# Patient Record
Sex: Male | Born: 1950
Health system: Southern US, Community
[De-identification: ages and names within clinical notes are randomized; demographics above are authoritative.]

## PROBLEM LIST (undated history)

## (undated) DIAGNOSIS — N183 Chronic kidney disease, stage 3 unspecified: Secondary | ICD-10-CM

## (undated) DIAGNOSIS — N529 Male erectile dysfunction, unspecified: Secondary | ICD-10-CM

## (undated) DIAGNOSIS — I10 Essential (primary) hypertension: Secondary | ICD-10-CM

## (undated) DIAGNOSIS — N318 Other neuromuscular dysfunction of bladder: Secondary | ICD-10-CM

## (undated) DIAGNOSIS — M51379 Other intervertebral disc degeneration, lumbosacral region without mention of lumbar back pain or lower extremity pain: Secondary | ICD-10-CM

## (undated) DIAGNOSIS — E66811 Obesity, class 1: Secondary | ICD-10-CM

## (undated) DIAGNOSIS — D72819 Decreased white blood cell count, unspecified: Secondary | ICD-10-CM

## (undated) DIAGNOSIS — D696 Thrombocytopenia, unspecified: Secondary | ICD-10-CM

## (undated) DIAGNOSIS — G4762 Sleep related leg cramps: Secondary | ICD-10-CM

## (undated) DIAGNOSIS — E669 Obesity, unspecified: Secondary | ICD-10-CM

## (undated) DIAGNOSIS — E785 Hyperlipidemia, unspecified: Secondary | ICD-10-CM

## (undated) DIAGNOSIS — N189 Chronic kidney disease, unspecified: Secondary | ICD-10-CM

## (undated) DIAGNOSIS — M160 Bilateral primary osteoarthritis of hip: Secondary | ICD-10-CM

## (undated) DIAGNOSIS — R600 Localized edema: Secondary | ICD-10-CM

## (undated) DIAGNOSIS — M1990 Unspecified osteoarthritis, unspecified site: Secondary | ICD-10-CM

## (undated) HISTORY — DX: Obesity, unspecified: E66.9

## (undated) HISTORY — DX: Hyperlipidemia, unspecified: E78.5

## (undated) HISTORY — DX: Obesity, class 1: E66.811

## (undated) HISTORY — DX: Essential (primary) hypertension: I10

## (undated) HISTORY — PX: TONSILLECTOMY: SUR1361

---

## 2004-07-25 ENCOUNTER — Ambulatory Visit (HOSPITAL_COMMUNITY): Admission: RE | Admit: 2004-07-25 | Discharge: 2004-07-25 | Payer: Self-pay | Admitting: *Deleted

## 2012-07-02 DIAGNOSIS — G4762 Sleep related leg cramps: Secondary | ICD-10-CM | POA: Insufficient documentation

## 2012-09-24 ENCOUNTER — Ambulatory Visit: Payer: Self-pay | Admitting: Cardiology

## 2012-09-27 ENCOUNTER — Ambulatory Visit: Payer: Self-pay | Admitting: Gastroenterology

## 2012-10-12 ENCOUNTER — Ambulatory Visit: Payer: Self-pay | Admitting: Cardiology

## 2012-10-14 ENCOUNTER — Ambulatory Visit (INDEPENDENT_AMBULATORY_CARE_PROVIDER_SITE_OTHER): Payer: BC Managed Care – PPO | Admitting: Cardiology

## 2012-10-14 ENCOUNTER — Encounter: Payer: Self-pay | Admitting: Cardiology

## 2012-10-14 VITALS — BP 120/60 | HR 63 | Ht 77.0 in | Wt 276.5 lb

## 2012-10-14 DIAGNOSIS — R609 Edema, unspecified: Secondary | ICD-10-CM

## 2012-10-14 DIAGNOSIS — R29898 Other symptoms and signs involving the musculoskeletal system: Secondary | ICD-10-CM

## 2012-10-14 DIAGNOSIS — R600 Localized edema: Secondary | ICD-10-CM

## 2012-10-14 DIAGNOSIS — I10 Essential (primary) hypertension: Secondary | ICD-10-CM

## 2012-10-14 DIAGNOSIS — N529 Male erectile dysfunction, unspecified: Secondary | ICD-10-CM

## 2012-10-14 DIAGNOSIS — E785 Hyperlipidemia, unspecified: Secondary | ICD-10-CM

## 2012-10-14 DIAGNOSIS — E669 Obesity, unspecified: Secondary | ICD-10-CM

## 2012-10-14 DIAGNOSIS — R252 Cramp and spasm: Secondary | ICD-10-CM

## 2012-10-14 NOTE — Patient Instructions (Addendum)
Please wear support stocking  Your physician has requested that you have a lower  extremity venous duplex. This test is an ultrasound of the veins in the legs . It looks at venous blood flow that carries blood from the heart to the legs . Allow one hour for a Lower Venous exam. . There are no restrictions or special instructions.   Your physician wants you to follow-up in 12 months Dr Herbie Baltimore.  You will receive a reminder letter in the mail two months in advance. If you don't receive a letter, please call our office to schedule the follow-up appointment.

## 2012-10-20 DIAGNOSIS — N318 Other neuromuscular dysfunction of bladder: Secondary | ICD-10-CM | POA: Insufficient documentation

## 2012-10-20 DIAGNOSIS — N529 Male erectile dysfunction, unspecified: Secondary | ICD-10-CM | POA: Insufficient documentation

## 2012-10-26 ENCOUNTER — Encounter (HOSPITAL_COMMUNITY): Payer: BC Managed Care – PPO

## 2012-10-28 ENCOUNTER — Encounter: Payer: Self-pay | Admitting: Cardiology

## 2012-10-28 ENCOUNTER — Ambulatory Visit (HOSPITAL_COMMUNITY)
Admission: RE | Admit: 2012-10-28 | Discharge: 2012-10-28 | Disposition: A | Discharge status: Home / Self Care | Payer: BC Managed Care – PPO | Source: Ambulatory Visit | Attending: Cardiology | Admitting: Cardiology

## 2012-10-28 DIAGNOSIS — M7989 Other specified soft tissue disorders: Secondary | ICD-10-CM

## 2012-10-28 DIAGNOSIS — R600 Localized edema: Secondary | ICD-10-CM

## 2012-10-28 DIAGNOSIS — I10 Essential (primary) hypertension: Secondary | ICD-10-CM | POA: Insufficient documentation

## 2012-10-28 DIAGNOSIS — E785 Hyperlipidemia, unspecified: Secondary | ICD-10-CM | POA: Insufficient documentation

## 2012-10-28 DIAGNOSIS — E669 Obesity, unspecified: Secondary | ICD-10-CM | POA: Insufficient documentation

## 2012-10-28 DIAGNOSIS — R252 Cramp and spasm: Secondary | ICD-10-CM

## 2012-10-28 DIAGNOSIS — R29898 Other symptoms and signs involving the musculoskeletal system: Secondary | ICD-10-CM | POA: Insufficient documentation

## 2012-10-28 NOTE — Assessment & Plan Note (Addendum)
He certainly is doing not activity and exertion.  He really was not in need to adjust his dietary regimen to ensure the calories his burning are are not in excess.

## 2012-10-28 NOTE — Assessment & Plan Note (Addendum)
He is now on Pravachol and omega-3 fatty acids that has been followed by his primary care physician.  He seems to be tolerating the protocol relatively well,  However he is having leg cramping.  We may want to consider giving him a medication holiday to see if the cramping improves.

## 2012-10-28 NOTE — Assessment & Plan Note (Signed)
Had previously been on Cialis.  He is no longer on that medication.

## 2012-10-28 NOTE — Assessment & Plan Note (Signed)
Well-controlled on current regimen which is a combination of ACE inhibitor and calcium channel blocker.  With a heart rate in the 60s, there would not be much from his beta blocker.

## 2012-10-28 NOTE — Progress Notes (Signed)
Patient ID: Stephen Schmidt, male   DOB: [birth year], 62 y.o.   MRN: [id number] PCP: No primary provider on file.  Clinic Note: Chief Complaint  Patient presents with  . 22 month visit    no chest pain ,no sob, little sweelling legs and feet   HPI: Stephen Schmidt is a 62 y.o. male, for patient of Dr. Julieanne Manson with a PMH of hypertension and hyperlipidemia as well as formally being morbidly obese.  He was last seen in November 2012, he now presents today for followup to refill medications..  Interval History: He seems to be doing relatively well overall.  Especially from cardiac standpoint.  He does significant amount of exertion with his spinning classes.  With that he denies any chest tightness or pressure or dyspnea at either rest or exertion.  He does note however the evening is having pretty significant cramping in his legs as well as a mild edema.  Despite his exercising, his diet has kind of gotten worse and he's gained back about 20 pounds in last time he was seen.  He denies any other likely thing would actual exercise, is more falling exertion.  He does note mild lower extremity edema with a mild varicose veins as well.  The remainder of Cardiovascular ROS: no chest pain or dyspnea on exertion negative for - dyspnea on exertion, irregular heartbeat, loss of consciousness, murmur, orthopnea, paroxysmal nocturnal dyspnea, rapid heart rate or shortness of breath is as follows: Additional cardiac review of systems: Lightheadedness - no, dizziness - no, syncope/near-syncope - no; TIA/amaurosis fugax - no Melena - no, hematochezia no; hematuria - no; nosebleeds - no; claudication - no  Past Medical History  Diagnosis Date  . Hypertension   . Hyperlipidemia   . Obesity, Class I, BMI 30.0-34.9 (see actual BMI)     However notably lighter used to be.  Once 324 pounds in 1996    Prior Cardiac Evaluation and Past Surgical History: History reviewed. No pertinent past surgical  history.    Not on File  Current Outpatient Prescriptions  Medication Sig Dispense Refill  . amLODipine-benazepril (LOTREL) 10-40 MG per capsule Take 1 capsule by mouth daily.      . Cyanocobalamin (VITAMIN B 12 PO) Take by mouth.      . Multiple Vitamin (MULTIVITAMIN) tablet Take 1 tablet by mouth daily.      . Omega-3 Fatty Acids (FISH OIL) 1000 MG CAPS Take 2,000 mg by mouth daily.      . pravastatin (PRAVACHOL) 20 MG tablet Take 20 mg by mouth daily.       No current facility-administered medications for this visit.    History   Social History Narrative   Married with 3 children.  Wife has signs of dementia.   Rare alcohol.  No cigarettes.  Good diet.   Routinely exercises doing bicycle "spinning "does it 3-4 days a week for up to 45 minutes at a time.   He is self-employed in a [employer].    ROS: A comprehensive Review of Systems - Negative except Lotion read edema, cramping as noted above.  No restless leg symptoms.  No exertional leg pains.  PHYSICAL EXAM BP 120/60  Pulse 63  Ht 6\' 5"  (1.956 m)  Wt 276 lb 8 oz (125.42 kg)  BMI 32.78 kg/m2 General appearance: alert, cooperative, appears stated age, no distress, mildly obese and Very tall.  Well-nourished and well-groomed.  Mild truncal obesity.  Normal mood and affect. Neck:  no adenopathy, no carotid bruit, no JVD, supple, symmetrical, trachea midline and thyroid not enlarged, symmetric, no tenderness/mass/nodules Lungs: clear to auscultation bilaterally, normal percussion bilaterally and Nonlabored, good air movement Heart: regular rate and rhythm, S1, S2 normal, no murmur, click, rub or gallop and normal apical impulse Abdomen: soft, non-tender; bowel sounds normal; no masses,  no organomegaly Extremities: extremities normal, atraumatic, no cyanosis or edema and Trace edema may be notable.  No venous stasis changes.  Does have some varicosities in the bilateral lower extremities. Pulses:  2+ and symmetric Neurologic: Alert and oriented X 3, normal strength and tone. Normal symmetric reflexes. Normal coordination and gait  [id number] today: Yes Rate: 63 , Rhythm: Normal Sinus Rhythm; ST depression in lead 3 only otherwise nonspecific ST-T changes.  No significant change.  Recent Labs: None really available.    ASSESSMENT / PLAN: Edema of both legs - with heaviness, swelling and cramping Simply based on his height, and that it is work requires him to be on his feet for some time, I do worry about him developing some venous stasis.   He otherwise does not have any symptoms to suggest heart failure as a source for his edema.  Plan: Lower extremity venous Dopplers; would consider arterial Dopplers if cramping and discomfort persists  Essential hypertension Well-controlled on current regimen which is a combination of ACE inhibitor and calcium channel blocker.  With a heart rate in the 60s, there would not be much from his beta blocker.  Hyperlipidemia He is now on Pravachol and omega-3 fatty acids that has been followed by his primary care physician.  He seems to be tolerating the protocol relatively well,  However he is having leg cramping.  We may want to consider giving him a medication holiday to see if the cramping improves.  Impotence of organic origin Had previously been on Cialis.  He is no longer on that medication.  Obesity (BMI 30-39.9) He certainly is doing not activity and exertion.  He really was not in need to adjust his dietary regimen to ensure the calories his burning are are not in excess.    Orders Placed This Encounter  Procedures  . EKG 12-Lead  . Lower Extremity Venous Duplex    Legs cramp Leg swelling Leg heaviness Look for venous insuff    Standing Status: Future     Number of Occurrences: 1     Standing Expiration Date: 10/14/2013    Order Specific Question:  Where should this test be performed:    Answer:  [facility]    Followup: 1 year  Orren Pietsch W. Herbie Baltimore, M.D., M.S. [facility] [address] [place], [place]  [address]  [phone] Pager # [phone]

## 2012-10-28 NOTE — Progress Notes (Signed)
Lower Extremity Venous Duplex Completed. °Brianna L Mazza,RVT °

## 2012-10-28 NOTE — Assessment & Plan Note (Signed)
Simply based on his height, and that it is work requires him to be on his feet for some time, I do worry about him developing some venous stasis.   He otherwise does not have any symptoms to suggest heart failure as a source for his edema.  Plan: Lower extremity venous Dopplers; would consider arterial Dopplers if cramping and discomfort persists

## 2012-11-04 ENCOUNTER — Telehealth: Payer: Self-pay | Admitting: *Deleted

## 2012-11-04 NOTE — Telephone Encounter (Signed)
Spoke to patient. Result given . Verbalized understanding  

## 2012-11-04 NOTE — Telephone Encounter (Signed)
Message copied by Tobin Chad on Thu Nov 04, 2012  4:27 PM ------      Message from: [facility], DAVID      Created: Tue Nov 02, 2012  5:19 PM       No sign of significant large vein insufficiency.            Marykay Lex, MD ------

## 2017-04-19 ENCOUNTER — Emergency Department
Admission: EM | Admit: 2017-04-19 | Discharge: 2017-04-19 | Disposition: A | Discharge status: Home / Self Care | Payer: PRIVATE HEALTH INSURANCE | Attending: Emergency Medicine | Admitting: Emergency Medicine

## 2017-04-19 ENCOUNTER — Encounter: Payer: Self-pay | Admitting: Emergency Medicine

## 2017-04-19 ENCOUNTER — Emergency Department: Payer: PRIVATE HEALTH INSURANCE

## 2017-04-19 DIAGNOSIS — S83411A Sprain of medial collateral ligament of right knee, initial encounter: Secondary | ICD-10-CM | POA: Diagnosis not present

## 2017-04-19 DIAGNOSIS — Y929 Unspecified place or not applicable: Secondary | ICD-10-CM | POA: Insufficient documentation

## 2017-04-19 DIAGNOSIS — Z79899 Other long term (current) drug therapy: Secondary | ICD-10-CM | POA: Insufficient documentation

## 2017-04-19 DIAGNOSIS — Y999 Unspecified external cause status: Secondary | ICD-10-CM | POA: Insufficient documentation

## 2017-04-19 DIAGNOSIS — Y939 Activity, unspecified: Secondary | ICD-10-CM | POA: Insufficient documentation

## 2017-04-19 DIAGNOSIS — S8991XA Unspecified injury of right lower leg, initial encounter: Secondary | ICD-10-CM | POA: Diagnosis present

## 2017-04-19 DIAGNOSIS — X501XXA Overexertion from prolonged static or awkward postures, initial encounter: Secondary | ICD-10-CM | POA: Diagnosis not present

## 2017-04-19 DIAGNOSIS — I10 Essential (primary) hypertension: Secondary | ICD-10-CM | POA: Diagnosis not present

## 2017-04-19 MED ORDER — TRAMADOL HCL 50 MG PO TABS: 50.0000 mg | ORAL_TABLET | Freq: Four times a day (QID) | ORAL | 0 refills | Status: DC | PRN

## 2017-04-19 MED ORDER — MELOXICAM 15 MG PO TABS: 15.0000 mg | ORAL_TABLET | Freq: Every day | ORAL | 0 refills | Status: DC

## 2017-04-19 NOTE — ED Triage Notes (Signed)
See first nurse note. 

## 2017-04-19 NOTE — ED Provider Notes (Signed)
[facility] Emergency Department Provider Note ____________________________________________  Time seen: Approximately 10:47 AM  I have reviewed the triage vital signs and the nursing notes.   HISTORY  Chief Complaint Knee Pain    HPI Stephen Schmidt is a 67 y.o. male who presents to the emergency department for evaluation and treatment of right knee and leg pain.  Last night, he slipped in some mud and twisted the right knee.  Patient has a significant past medical history of significant arthritis in both knees, patient did not fall and was able to walk back into the house after the injury.  He states that after sitting on the couch for a few minutes the knee became extremely painful and he had to crawl to the bedroom.  Today, he has had an increase in pain and has not been able to bear weight on the knee unless he used an umbrella to help support his weight.  He has not taken any medications for this pain. Past Medical History:  Diagnosis Date  . Hyperlipidemia   . Hypertension   . Obesity, Class I, BMI 30.0-34.9 (see actual BMI)    However notably lighter used to be.  Once 324 pounds in 1996    Patient Active Problem List   Diagnosis Date Noted  . Obesity (BMI 30-39.9) 10/28/2012    Class: Diagnosis of  . Essential hypertension 10/28/2012  . Edema of both legs - with heaviness, swelling and cramping 10/28/2012  . Hyperlipidemia   . Impotence of organic origin 10/20/2012  . Hypertonicity of bladder 10/20/2012  . Sleep related leg cramps 07/02/2012    History reviewed. No pertinent surgical history.  Prior to Admission medications   Medication Sig Start Date End Date Taking? Authorizing Provider  amLODipine-benazepril (LOTREL) 10-40 MG per capsule Take 1 capsule by mouth daily.    [provider]  Cyanocobalamin (VITAMIN B 12 PO) Take by mouth.    [provider]  meloxicam (MOBIC) 15 MG tablet Take 1 tablet (15 mg total) by mouth  daily. 04/19/17   Dnya Hickle, Rulon Eisenmenger B, FNP  Multiple Vitamin (MULTIVITAMIN) tablet Take 1 tablet by mouth daily.    [provider]  Omega-3 Fatty Acids (FISH OIL) 1000 MG CAPS Take 2,000 mg by mouth daily.    [provider]  pravastatin (PRAVACHOL) 20 MG tablet Take 20 mg by mouth daily.    [provider]  traMADol (ULTRAM) 50 MG tablet Take 1 tablet (50 mg total) by mouth every 6 (six) hours as needed. 04/19/17   Chinita Pester, FNP    Allergies Patient has no known allergies.  Family History  Problem Relation Age of Onset  . Heart failure Mother   . Kidney disease Sister   . Heart attack Cousin 46    Social History Social History   Tobacco Use  . Smoking status: Never Smoker  . Smokeless tobacco: Never Used  Substance Use Topics  . Alcohol use: Yes    Comment: wine from time to tie  . Drug use: No    Review of Systems Constitutional: Negative for recent illness. Cardiovascular: Negative for chest pain. Respiratory: Negative for cough or shortness of breath. Musculoskeletal: Positive for right knee pain. Skin: Negative for rash, lesion, or wound. Neurological: Negative for paresthesias or loss of sensation.  ____________________________________________   PHYSICAL EXAM:  VITAL SIGNS: Blood pressure is 136/64, heart rate 64, temperature is 98.2, respiratory rate is 20, SPO2 is 98% on room air. ED Triage  Vitals  Enc Vitals Group     BP      Pulse      Resp      Temp      Temp src      SpO2      Weight      Height      Head Circumference      Peak Flow      Pain Score      Pain Loc      Pain Edu?      Excl. in GC?     Constitutional: Alert and oriented. Well appearing and in no acute distress. Eyes: Conjunctivae are clear without discharge or drainage Head: Atraumatic Neck: Supple Respiratory: Respirations even and unlabored. Musculoskeletal: Right knee mildly edematous over the medial aspect.  Patient has increase in pain with  valgus stressor.  4 out of 5 strength against resistance secondary to pain.  No deformity is noted.  No pain on exam of the right hip or ankle. Neurologic: Motor and sensory function is intact, specifically over the right lower extremity. Skin: Intact Psychiatric: Affect and behavior are appropriate.  ____________________________________________   LABS (all labs ordered are listed, but only abnormal results are displayed)  Labs Reviewed - No data to display ____________________________________________  RADIOLOGY  Image of the right knee indicates no acute bony abnormality per radiology. ____________________________________________   PROCEDURES  Procedures  ____________________________________________   INITIAL IMPRESSION / ASSESSMENT AND PLAN / ED COURSE  Stephen Schmidt is a 67 y.o. male who presents to the emergency department after a twist injury to the right knee.  Patient has a significant past medical history of arthritis in the knees.  X-ray and exam are consistent with ligamentous source of pain.  Knee immobilizer was applied by ER tech.  And patient was given crutches.  He was instructed to use a crutch under the right side to assist with weightbearing.  He was encouraged to rest, ice, and elevate the extremity as well as wear the brace anytime he is out of bed.  He was instructed to follow-up with orthopedics if symptoms do not improve over the week.  He was instructed to return to the emergency department for symptoms of change or worsen if he is unable to schedule an appointment.  He will be given a prescription for tramadol and meloxicam.  Medications - No data to display  Pertinent labs & imaging results that were available during my care of the patient were reviewed by me and considered in my medical decision making (see chart for details).  _________________________________________   FINAL CLINICAL IMPRESSION(S) / ED DIAGNOSES  Final diagnoses:  Sprain of medial  collateral ligament of right knee, initial encounter    ED Discharge Orders        Ordered    meloxicam (MOBIC) 15 MG tablet  Daily     04/19/17 1251    traMADol (ULTRAM) 50 MG tablet  Every 6 hours PRN     04/19/17 1251       If controlled substance prescribed during this visit, 12 month history viewed on the [facility] prior to issuing an initial prescription for Schedule II or III opiod.    Chinita Pesterriplett, [place] B, FNP 04/19/17 1329    Governor RooksLord, Rebecca, MD 04/19/17 1355

## 2017-04-19 NOTE — ED Notes (Signed)
See triage note  States he slipped in mud last pm  Having pain to right leg/knee  States having increased pain with standing

## 2017-04-19 NOTE — ED Notes (Signed)
FN; pt to ed via ems pt fell last night slipped in the mud injuring right leg. vss per ems. Unable to bear weight on right leg.

## 2017-04-19 NOTE — ED Notes (Signed)
EMS pt from home , right knee pain last night, per ems states , Vitals are stable.

## 2017-12-03 DIAGNOSIS — M19011 Primary osteoarthritis, right shoulder: Secondary | ICD-10-CM | POA: Diagnosis not present

## 2017-12-03 DIAGNOSIS — M7541 Impingement syndrome of right shoulder: Secondary | ICD-10-CM | POA: Diagnosis not present

## 2017-12-03 DIAGNOSIS — M7551 Bursitis of right shoulder: Secondary | ICD-10-CM | POA: Diagnosis not present

## 2017-12-03 DIAGNOSIS — G8929 Other chronic pain: Secondary | ICD-10-CM | POA: Diagnosis not present

## 2017-12-03 DIAGNOSIS — M25511 Pain in right shoulder: Secondary | ICD-10-CM | POA: Diagnosis not present

## 2017-12-10 ENCOUNTER — Other Ambulatory Visit: Payer: Self-pay | Admitting: Sports Medicine

## 2017-12-10 DIAGNOSIS — M7541 Impingement syndrome of right shoulder: Secondary | ICD-10-CM

## 2017-12-10 DIAGNOSIS — M25511 Pain in right shoulder: Secondary | ICD-10-CM

## 2017-12-10 DIAGNOSIS — G8929 Other chronic pain: Secondary | ICD-10-CM

## 2017-12-24 ENCOUNTER — Ambulatory Visit: Admission: EM | Admit: 2017-12-24 | Discharge: 2017-12-24 | Discharge status: Absent without leave | Payer: PPO

## 2017-12-24 ENCOUNTER — Ambulatory Visit
Admission: RE | Admit: 2017-12-24 | Discharge: 2017-12-24 | Disposition: A | Discharge status: Home / Self Care | Payer: PPO | Source: Ambulatory Visit | Attending: Sports Medicine | Admitting: Sports Medicine

## 2017-12-24 DIAGNOSIS — M7541 Impingement syndrome of right shoulder: Secondary | ICD-10-CM | POA: Insufficient documentation

## 2017-12-24 DIAGNOSIS — M75121 Complete rotator cuff tear or rupture of right shoulder, not specified as traumatic: Secondary | ICD-10-CM | POA: Insufficient documentation

## 2017-12-24 DIAGNOSIS — S46111A Strain of muscle, fascia and tendon of long head of biceps, right arm, initial encounter: Secondary | ICD-10-CM | POA: Insufficient documentation

## 2017-12-24 DIAGNOSIS — G8929 Other chronic pain: Secondary | ICD-10-CM

## 2017-12-24 DIAGNOSIS — M75111 Incomplete rotator cuff tear or rupture of right shoulder, not specified as traumatic: Secondary | ICD-10-CM | POA: Diagnosis not present

## 2017-12-24 DIAGNOSIS — M25511 Pain in right shoulder: Secondary | ICD-10-CM | POA: Insufficient documentation

## 2017-12-24 DIAGNOSIS — X58XXXA Exposure to other specified factors, initial encounter: Secondary | ICD-10-CM | POA: Diagnosis not present

## 2017-12-27 ENCOUNTER — Ambulatory Visit
Admission: RE | Admit: 2017-12-27 | Discharge: 2017-12-27 | Disposition: A | Discharge status: Home / Self Care | Payer: PPO | Source: Ambulatory Visit | Attending: Sports Medicine | Admitting: Sports Medicine

## 2018-01-06 DIAGNOSIS — M75121 Complete rotator cuff tear or rupture of right shoulder, not specified as traumatic: Secondary | ICD-10-CM | POA: Diagnosis not present

## 2018-01-06 DIAGNOSIS — G8929 Other chronic pain: Secondary | ICD-10-CM | POA: Diagnosis not present

## 2018-01-06 DIAGNOSIS — M25511 Pain in right shoulder: Secondary | ICD-10-CM | POA: Diagnosis not present

## 2018-02-02 DIAGNOSIS — I10 Essential (primary) hypertension: Secondary | ICD-10-CM | POA: Diagnosis not present

## 2018-02-02 DIAGNOSIS — E7800 Pure hypercholesterolemia, unspecified: Secondary | ICD-10-CM | POA: Diagnosis not present

## 2018-02-04 ENCOUNTER — Other Ambulatory Visit: Payer: Self-pay

## 2018-02-04 ENCOUNTER — Other Ambulatory Visit: Payer: PPO

## 2018-02-04 ENCOUNTER — Encounter
Admission: RE | Admit: 2018-02-04 | Discharge: 2018-02-04 | Disposition: A | Discharge status: Home / Self Care | Payer: PPO | Source: Ambulatory Visit | Attending: Orthopedic Surgery | Admitting: Orthopedic Surgery

## 2018-02-04 DIAGNOSIS — R001 Bradycardia, unspecified: Secondary | ICD-10-CM | POA: Insufficient documentation

## 2018-02-04 DIAGNOSIS — Z01810 Encounter for preprocedural cardiovascular examination: Secondary | ICD-10-CM | POA: Diagnosis not present

## 2018-02-04 DIAGNOSIS — I10 Essential (primary) hypertension: Secondary | ICD-10-CM | POA: Insufficient documentation

## 2018-02-04 NOTE — Patient Instructions (Addendum)
Your procedure is scheduled on: Monday 02/15/18.  Report to DAY SURGERY DEPARTMENT LOCATED ON 2ND FLOOR MEDICAL MALL ENTRANCE. To find out your arrival time please call [phone] between 1PM - 3PM on Friday 02/12/18.  Remember: Instructions that are not followed completely may result in serious medical risk, up to and including death, or upon the discretion of your surgeon and anesthesiologist your surgery may need to be rescheduled.     _X__ 1. Do not eat food after midnight the night before your procedure.                 No gum chewing or hard candies. You may drink clear liquids up to 2 hours                 before you are scheduled to arrive for your surgery- DO NOT drink clear                 liquids within 2 hours of the start of your surgery.                 Clear Liquids include:  water, apple juice without pulp, clear carbohydrate                 drink such as Clearfast or Gatorade, Black Coffee or Tea (Do not add                 anything to coffee or tea).  __X__2.  On the morning of surgery brush your teeth with toothpaste and water, you may rinse your mouth with mouthwash if you wish.  Do not swallow any toothpaste or mouthwash.      _X__ 3.  No Alcohol for 24 hours before or after surgery.    _X__ 4.  Do Not Smoke or use e-cigarettes For 24 Hours Prior to Your Surgery.                 Do not use any chewable tobacco products for at least 6 hours prior to                 surgery.   __X__5.  Notify your doctor if there is any change in your medical condition      (cold, fever, infections).      Do not wear jewelry, make-up, hairpins, clips or nail polish. Do not wear lotions, powders, or perfumes.  Do not wear deodorant. Do not shave 48 hours prior to surgery. Men may shave face and neck. Do not bring valuables to the hospital.     [facility] not responsible for any belongings or valuables.   Contacts, dentures/partials or body piercings may not be worn  into surgery. Bring a case for your contacts, glasses or hearing aids, a denture cup will be supplied.    Patients discharged the day of surgery will not be allowed to drive home.    Please read over the following fact sheets that you were given:   MRSA Information   __X__ Take these medicines the morning of surgery with A SIP OF WATER:    1. acetaminophen (TYLENOL) 500 MG tablet     __X__ Use CHG Soap as directed    __X__ Stop Anti-inflammatories 7 days before surgery such as Advil, Ibuprofen, Motrin, BC or Goodies Powder, Naprosyn, Naproxen, Aleve, Aspirin, Meloxicam. May take Tylenol if needed for pain or discomfort.     __X__ Stop the following herbal supplements on Monday 02/08/18:  Apple  Cider Vinegar 600 MG CAPS Ascorbic Acid (VITAMIN C ADULT GUMMIES PO) Cinnamon 500 MG TABS Coenzyme Q10 (COQ10 GUMMIES ADULT PO) Misc Natural Products (HERBAL ENERGY COMPLEX PO) Chaga Mushroom Supplement Shark Cartilage 740 MG CAPS Turmeric 500 MG CAPS   __X__ Do not begin taking any new herbal supplements or vitamins before your surgery.

## 2018-02-09 DIAGNOSIS — Z0000 Encounter for general adult medical examination without abnormal findings: Secondary | ICD-10-CM | POA: Diagnosis not present

## 2018-02-09 DIAGNOSIS — E669 Obesity, unspecified: Secondary | ICD-10-CM | POA: Diagnosis not present

## 2018-02-09 DIAGNOSIS — Z862 Personal history of diseases of the blood and blood-forming organs and certain disorders involving the immune mechanism: Secondary | ICD-10-CM | POA: Diagnosis not present

## 2018-02-09 DIAGNOSIS — Z23 Encounter for immunization: Secondary | ICD-10-CM | POA: Diagnosis not present

## 2018-02-09 DIAGNOSIS — E7800 Pure hypercholesterolemia, unspecified: Secondary | ICD-10-CM | POA: Diagnosis not present

## 2018-02-09 DIAGNOSIS — I10 Essential (primary) hypertension: Secondary | ICD-10-CM | POA: Diagnosis not present

## 2018-02-10 DIAGNOSIS — M75121 Complete rotator cuff tear or rupture of right shoulder, not specified as traumatic: Secondary | ICD-10-CM | POA: Diagnosis not present

## 2018-02-15 ENCOUNTER — Ambulatory Visit: Payer: PPO | Admitting: Anesthesiology

## 2018-02-15 ENCOUNTER — Other Ambulatory Visit: Payer: Self-pay

## 2018-02-15 ENCOUNTER — Encounter: Payer: Self-pay | Admitting: Emergency Medicine

## 2018-02-15 ENCOUNTER — Encounter
Admission: RE | Disposition: A | Discharge status: Home / Self Care | Payer: Self-pay | Source: Ambulatory Visit | Attending: Orthopedic Surgery

## 2018-02-15 ENCOUNTER — Ambulatory Visit
Admission: RE | Admit: 2018-02-15 | Discharge: 2018-02-15 | Disposition: A | Discharge status: Home / Self Care | Payer: PPO | Source: Ambulatory Visit | Attending: Orthopedic Surgery | Admitting: Orthopedic Surgery

## 2018-02-15 DIAGNOSIS — Z8249 Family history of ischemic heart disease and other diseases of the circulatory system: Secondary | ICD-10-CM | POA: Insufficient documentation

## 2018-02-15 DIAGNOSIS — Z6830 Body mass index (BMI) 30.0-30.9, adult: Secondary | ICD-10-CM | POA: Diagnosis not present

## 2018-02-15 DIAGNOSIS — M7541 Impingement syndrome of right shoulder: Secondary | ICD-10-CM | POA: Insufficient documentation

## 2018-02-15 DIAGNOSIS — M75111 Incomplete rotator cuff tear or rupture of right shoulder, not specified as traumatic: Secondary | ICD-10-CM | POA: Diagnosis not present

## 2018-02-15 DIAGNOSIS — Z841 Family history of disorders of kidney and ureter: Secondary | ICD-10-CM | POA: Diagnosis not present

## 2018-02-15 DIAGNOSIS — M7581 Other shoulder lesions, right shoulder: Secondary | ICD-10-CM | POA: Insufficient documentation

## 2018-02-15 DIAGNOSIS — M75101 Unspecified rotator cuff tear or rupture of right shoulder, not specified as traumatic: Secondary | ICD-10-CM | POA: Diagnosis not present

## 2018-02-15 DIAGNOSIS — I10 Essential (primary) hypertension: Secondary | ICD-10-CM | POA: Insufficient documentation

## 2018-02-15 DIAGNOSIS — M19011 Primary osteoarthritis, right shoulder: Secondary | ICD-10-CM | POA: Diagnosis not present

## 2018-02-15 DIAGNOSIS — G8918 Other acute postprocedural pain: Secondary | ICD-10-CM | POA: Diagnosis not present

## 2018-02-15 DIAGNOSIS — M25511 Pain in right shoulder: Secondary | ICD-10-CM | POA: Diagnosis not present

## 2018-02-15 DIAGNOSIS — Z7982 Long term (current) use of aspirin: Secondary | ICD-10-CM | POA: Insufficient documentation

## 2018-02-15 DIAGNOSIS — E7800 Pure hypercholesterolemia, unspecified: Secondary | ICD-10-CM | POA: Diagnosis not present

## 2018-02-15 DIAGNOSIS — E785 Hyperlipidemia, unspecified: Secondary | ICD-10-CM | POA: Insufficient documentation

## 2018-02-15 DIAGNOSIS — M75121 Complete rotator cuff tear or rupture of right shoulder, not specified as traumatic: Secondary | ICD-10-CM | POA: Diagnosis not present

## 2018-02-15 DIAGNOSIS — M7521 Bicipital tendinitis, right shoulder: Secondary | ICD-10-CM | POA: Diagnosis not present

## 2018-02-15 DIAGNOSIS — Z79899 Other long term (current) drug therapy: Secondary | ICD-10-CM | POA: Diagnosis not present

## 2018-02-15 DIAGNOSIS — E669 Obesity, unspecified: Secondary | ICD-10-CM | POA: Diagnosis not present

## 2018-02-15 HISTORY — PX: SHOULDER ARTHROSCOPY WITH OPEN ROTATOR CUFF REPAIR: SHX6092

## 2018-02-15 SURGERY — ARTHROSCOPY, SHOULDER WITH REPAIR, ROTATOR CUFF, OPEN
Anesthesia: General | Site: Shoulder | Laterality: Right

## 2018-02-15 MED ORDER — SODIUM CHLORIDE 0.9 % IV SOLN
INTRAVENOUS | Status: DC | PRN
  Administered 2018-02-15: 30 ug/min via INTRAVENOUS

## 2018-02-15 MED ORDER — BUPIVACAINE LIPOSOME 1.3 % IJ SUSP
INTRAMUSCULAR | Status: AC
  Filled 2018-02-15: qty 20

## 2018-02-15 MED ORDER — MIDAZOLAM HCL 2 MG/2ML IJ SOLN
INTRAMUSCULAR | Status: AC
  Administered 2018-02-15: 1 mg via INTRAVENOUS
  Filled 2018-02-15: qty 2

## 2018-02-15 MED ORDER — MIDAZOLAM HCL 2 MG/2ML IJ SOLN
1.0000 mg | Freq: Once | INTRAMUSCULAR | Status: AC
  Administered 2018-02-15: 1 mg via INTRAVENOUS

## 2018-02-15 MED ORDER — CEFAZOLIN SODIUM-DEXTROSE 2-4 GM/100ML-% IV SOLN
INTRAVENOUS | Status: AC
  Filled 2018-02-15: qty 100

## 2018-02-15 MED ORDER — SUCCINYLCHOLINE CHLORIDE 20 MG/ML IJ SOLN
INTRAMUSCULAR | Status: DC | PRN
  Administered 2018-02-15: 120 mg via INTRAVENOUS

## 2018-02-15 MED ORDER — ONDANSETRON 4 MG PO TBDP: 4.0000 mg | ORAL_TABLET | Freq: Three times a day (TID) | ORAL | 0 refills | Status: DC | PRN

## 2018-02-15 MED ORDER — MIDAZOLAM HCL 2 MG/2ML IJ SOLN
INTRAMUSCULAR | Status: DC | PRN
  Administered 2018-02-15: 2 mg via INTRAVENOUS

## 2018-02-15 MED ORDER — ASPIRIN EC 325 MG PO TBEC: 325.0000 mg | DELAYED_RELEASE_TABLET | Freq: Every day | ORAL | 0 refills | Status: AC

## 2018-02-15 MED ORDER — GLYCOPYRROLATE 0.2 MG/ML IJ SOLN
INTRAMUSCULAR | Status: DC | PRN
  Administered 2018-02-15: 0.2 mg via INTRAVENOUS

## 2018-02-15 MED ORDER — FENTANYL CITRATE (PF) 100 MCG/2ML IJ SOLN
INTRAMUSCULAR | Status: DC | PRN
  Administered 2018-02-15: 100 ug via INTRAVENOUS

## 2018-02-15 MED ORDER — ACETAMINOPHEN 500 MG PO TABS: 1000.0000 mg | ORAL_TABLET | Freq: Three times a day (TID) | ORAL | 2 refills | Status: AC

## 2018-02-15 MED ORDER — PROPOFOL 10 MG/ML IV BOLUS
INTRAVENOUS | Status: DC | PRN
  Administered 2018-02-15: 40 mg via INTRAVENOUS
  Administered 2018-02-15: 200 mg via INTRAVENOUS

## 2018-02-15 MED ORDER — PROMETHAZINE HCL 25 MG/ML IJ SOLN: 6.2500 mg | INTRAMUSCULAR | Status: DC | PRN

## 2018-02-15 MED ORDER — ONDANSETRON HCL 4 MG/2ML IJ SOLN
INTRAMUSCULAR | Status: DC | PRN
  Administered 2018-02-15 (×2): 4 mg via INTRAVENOUS

## 2018-02-15 MED ORDER — LIDOCAINE HCL (CARDIAC) PF 100 MG/5ML IV SOSY
PREFILLED_SYRINGE | INTRAVENOUS | Status: DC | PRN
  Administered 2018-02-15: 100 mg via INTRAVENOUS

## 2018-02-15 MED ORDER — EPINEPHRINE 30 MG/30ML IJ SOLN
INTRAMUSCULAR | Status: AC
  Filled 2018-02-15: qty 1

## 2018-02-15 MED ORDER — BUPIVACAINE HCL (PF) 0.5 % IJ SOLN
INTRAMUSCULAR | Status: DC | PRN
  Administered 2018-02-15: 10 mL via PERINEURAL

## 2018-02-15 MED ORDER — PROPOFOL 10 MG/ML IV BOLUS
INTRAVENOUS | Status: AC
  Filled 2018-02-15: qty 20

## 2018-02-15 MED ORDER — BUPIVACAINE HCL (PF) 0.5 % IJ SOLN
INTRAMUSCULAR | Status: AC
  Filled 2018-02-15: qty 10

## 2018-02-15 MED ORDER — LIDOCAINE HCL (PF) 1 % IJ SOLN
INTRAMUSCULAR | Status: DC | PRN
  Administered 2018-02-15: 5 mL via SUBCUTANEOUS

## 2018-02-15 MED ORDER — LIDOCAINE HCL (PF) 1 % IJ SOLN
INTRAMUSCULAR | Status: AC
  Filled 2018-02-15: qty 5

## 2018-02-15 MED ORDER — LACTATED RINGERS IV SOLN
INTRAVENOUS | Status: DC
  Administered 2018-02-15 (×2): via INTRAVENOUS

## 2018-02-15 MED ORDER — FAMOTIDINE 20 MG PO TABS
20.0000 mg | ORAL_TABLET | Freq: Once | ORAL | Status: AC
  Administered 2018-02-15: 20 mg via ORAL

## 2018-02-15 MED ORDER — FAMOTIDINE 20 MG PO TABS
ORAL_TABLET | ORAL | Status: AC
  Administered 2018-02-15: 20 mg via ORAL
  Filled 2018-02-15: qty 1

## 2018-02-15 MED ORDER — FENTANYL CITRATE (PF) 100 MCG/2ML IJ SOLN
INTRAMUSCULAR | Status: AC
  Administered 2018-02-15: 50 ug via INTRAVENOUS
  Filled 2018-02-15: qty 2

## 2018-02-15 MED ORDER — OXYCODONE HCL 5 MG PO TABS: 5.0000 mg | ORAL_TABLET | ORAL | 0 refills | Status: DC | PRN

## 2018-02-15 MED ORDER — FENTANYL CITRATE (PF) 100 MCG/2ML IJ SOLN: 25.0000 ug | INTRAMUSCULAR | Status: DC | PRN

## 2018-02-15 MED ORDER — BUPIVACAINE LIPOSOME 1.3 % IJ SUSP
INTRAMUSCULAR | Status: DC | PRN
  Administered 2018-02-15: 20 mL via PERINEURAL

## 2018-02-15 MED ORDER — SUGAMMADEX SODIUM 200 MG/2ML IV SOLN
INTRAVENOUS | Status: DC | PRN
  Administered 2018-02-15: 233 mg via INTRAVENOUS

## 2018-02-15 MED ORDER — FENTANYL CITRATE (PF) 100 MCG/2ML IJ SOLN
50.0000 ug | Freq: Once | INTRAMUSCULAR | Status: AC
  Administered 2018-02-15: 50 ug via INTRAVENOUS

## 2018-02-15 MED ORDER — LACTATED RINGERS IV SOLN
INTRAVENOUS | Status: DC | PRN
  Administered 2018-02-15: 15 mL

## 2018-02-15 MED ORDER — FENTANYL CITRATE (PF) 100 MCG/2ML IJ SOLN
INTRAMUSCULAR | Status: AC
  Filled 2018-02-15: qty 2

## 2018-02-15 MED ORDER — CEFAZOLIN SODIUM-DEXTROSE 2-4 GM/100ML-% IV SOLN
2.0000 g | Freq: Once | INTRAVENOUS | Status: AC
  Administered 2018-02-15: 2 g via INTRAVENOUS

## 2018-02-15 MED ORDER — ROCURONIUM BROMIDE 100 MG/10ML IV SOLN
INTRAVENOUS | Status: DC | PRN
  Administered 2018-02-15: 10 mg via INTRAVENOUS
  Administered 2018-02-15: 40 mg via INTRAVENOUS

## 2018-02-15 MED ORDER — MIDAZOLAM HCL 2 MG/2ML IJ SOLN
INTRAMUSCULAR | Status: AC
  Filled 2018-02-15: qty 2

## 2018-02-15 MED ORDER — DEXAMETHASONE SODIUM PHOSPHATE 10 MG/ML IJ SOLN
INTRAMUSCULAR | Status: DC | PRN
  Administered 2018-02-15: 10 mg via INTRAVENOUS

## 2018-02-15 SURGICAL SUPPLY — 99 items
ADAPTER IRRIG TUBE 2 SPIKE SOL (ADAPTER) ×6 IMPLANT
ADH SKN CLS APL DERMABOND .7 (GAUZE/BANDAGES/DRESSINGS) ×1
ADPR TBG 2 SPK PMP STRL ASCP (ADAPTER) ×2
ANCH SUT 1.3 FBRTK 2LD BLU WHT (Anchor) ×2 IMPLANT
ANCH SUT 2X2.3 TAPE (Anchor) ×2 IMPLANT
ANCH SUT SWLK 19.1X4.75 (Anchor) ×4 IMPLANT
ANCHOR 2.3 SP SGL 1.2 XBRAID (Anchor) ×2 IMPLANT
ANCHOR 2.3MM SP SGL 1.2 XBRAID (Anchor) ×2 IMPLANT
ANCHOR SUT BIO SW 4.75X19.1 (Anchor) ×8 IMPLANT
ANCHOR SUT FBRTK SUTURETAP 1.3 (Anchor) ×4 IMPLANT
BIT DRILL RIGD1.8MM FBRTK STRL (DRILL) IMPLANT
BLADE OSCILLATING/SAGITTAL (BLADE)
BLADE SW THK.38XMED LNG THN (BLADE) IMPLANT
BUR BR 5.5 12 FLUTE (BURR) ×2 IMPLANT
BUR RADIUS 4.0X18.5 (BURR) ×2 IMPLANT
CANNULA 5.75X7CM (CANNULA) ×1
CANNULA PART THRD DISP 5.75X7 (CANNULA) ×1 IMPLANT
CANNULA PARTIAL THREAD 2X7 (CANNULA) ×2 IMPLANT
CANNULA TWIST IN 8.25X9CM (CANNULA) IMPLANT
CHLORAPREP W/TINT 26ML (MISCELLANEOUS) ×3 IMPLANT
CLOSURE WOUND 1/2 X4 (GAUZE/BANDAGES/DRESSINGS)
COOLER POLAR GLACIER W/PUMP (MISCELLANEOUS) ×3 IMPLANT
COVER LIGHT HANDLE STERIS (MISCELLANEOUS) ×3 IMPLANT
COVER WAND RF STERILE (DRAPES) ×3 IMPLANT
CRADLE LAMINECT ARM (MISCELLANEOUS) ×6 IMPLANT
DERMABOND ADVANCED (GAUZE/BANDAGES/DRESSINGS) ×2
DERMABOND ADVANCED .7 DNX12 (GAUZE/BANDAGES/DRESSINGS) IMPLANT
DEVICE SUCT BLK HOLE OR FLOOR (MISCELLANEOUS) ×2 IMPLANT
DRAPE IMP U-DRAPE 54X76 (DRAPES) ×6 IMPLANT
DRAPE INCISE IOBAN 66X45 STRL (DRAPES) ×3 IMPLANT
DRAPE SHEET LG 3/4 BI-LAMINATE (DRAPES) ×3 IMPLANT
DRAPE STERI 35X30 U-POUCH (DRAPES) ×3 IMPLANT
DRAPE U-SHAPE 47X51 STRL (DRAPES) ×3 IMPLANT
DRILL RIGID 1.8MM FBRTK STRL (DRILL) ×3
DRSG TEGADERM 4X4.75 (GAUZE/BANDAGES/DRESSINGS) ×6 IMPLANT
ELECT REM PT RETURN 9FT ADLT (ELECTROSURGICAL) ×3
ELECTRODE REM PT RTRN 9FT ADLT (ELECTROSURGICAL) ×1 IMPLANT
GAUZE PETRO XEROFOAM 1X8 (MISCELLANEOUS) ×3 IMPLANT
GAUZE SPONGE 4X4 12PLY STRL (GAUZE/BANDAGES/DRESSINGS) ×3 IMPLANT
GLOVE BIOGEL PI IND STRL 7.5 (GLOVE) IMPLANT
GLOVE BIOGEL PI IND STRL 8 (GLOVE) ×1 IMPLANT
GLOVE BIOGEL PI INDICATOR 7.5 (GLOVE) ×10
GLOVE BIOGEL PI INDICATOR 8 (GLOVE) ×2
GLOVE SURG SYN 8.0 (GLOVE) ×3 IMPLANT
GLOVE SURG SYN 8.0 PF PI (GLOVE) ×1 IMPLANT
GOWN STRL REUS W/ TWL LRG LVL3 (GOWN DISPOSABLE) ×1 IMPLANT
GOWN STRL REUS W/TWL LRG LVL3 (GOWN DISPOSABLE) ×9
GOWN STRL REUS W/TWL LRG LVL4 (GOWN DISPOSABLE) ×3 IMPLANT
IV LACTATED RINGER IRRG 3000ML (IV SOLUTION) ×45
IV LR IRRIG 3000ML ARTHROMATIC (IV SOLUTION) ×4 IMPLANT
KIT SPEAR STR 1.6MM DRILL (MISCELLANEOUS) ×2 IMPLANT
KIT STABILIZATION SHOULDER (MISCELLANEOUS) ×3 IMPLANT
KIT TURNOVER KIT A (KITS) ×3 IMPLANT
MANIFOLD NEPTUNE II (INSTRUMENTS) ×5 IMPLANT
MASK FACE SPIDER DISP (MASK) ×3 IMPLANT
MAT ABSORB  FLUID 56X50 GRAY (MISCELLANEOUS) ×4
MAT ABSORB FLUID 56X50 GRAY (MISCELLANEOUS) ×2 IMPLANT
NDL MAYO 6 CRC TAPER PT (NEEDLE) IMPLANT
NDL MAYO CATGUT SZ5 (NEEDLE) ×3
NDL SAFETY ECLIPSE 18X1.5 (NEEDLE) ×1 IMPLANT
NDL SCORPION MULTI FIRE (NEEDLE) IMPLANT
NDL SUT 5 .5 CRC TPR PNT MAYO (NEEDLE) IMPLANT
NEEDLE HYPO 18GX1.5 SHARP (NEEDLE) ×3
NEEDLE HYPO 22GX1.5 SAFETY (NEEDLE) ×3 IMPLANT
NEEDLE MAYO 6 CRC TAPER PT (NEEDLE) ×3 IMPLANT
NEEDLE SCORPION MULTI FIRE (NEEDLE) ×3 IMPLANT
NS IRRIG 1000ML POUR BTL (IV SOLUTION) ×2 IMPLANT
PACK ARTHROSCOPY SHOULDER (MISCELLANEOUS) ×3 IMPLANT
PAD ABD DERMACEA PRESS 5X9 (GAUZE/BANDAGES/DRESSINGS) ×1 IMPLANT
PAD WRAPON POLAR SHDR XLG (MISCELLANEOUS) ×1 IMPLANT
SET TUBE SUCT SHAVER OUTFL 24K (TUBING) ×3 IMPLANT
SET TUBE TIP INTRA-ARTICULAR (MISCELLANEOUS) ×3 IMPLANT
SLING ULTRA II M (MISCELLANEOUS) ×1 IMPLANT
STAPLER SKIN PROX 35W (STAPLE) IMPLANT
STRAP SAFETY 5IN WIDE (MISCELLANEOUS) ×3 IMPLANT
STRIP CLOSURE SKIN 1/2X4 (GAUZE/BANDAGES/DRESSINGS) IMPLANT
SUT ETHILON 3-0 (SUTURE) ×2 IMPLANT
SUT ETHILON 4-0 (SUTURE) ×3
SUT ETHILON 4-0 FS2 18XMFL BLK (SUTURE) ×1
SUT LASSO 90 DEG SD STR (SUTURE) IMPLANT
SUT MNCRL 4-0 (SUTURE) ×3
SUT MNCRL 4-0 27XMFL (SUTURE) ×1
SUT PROLENE 0 CT 2 (SUTURE) IMPLANT
SUT TICRON 2-0 30IN 311381 (SUTURE) IMPLANT
SUT TIGER TAPE 7 IN WHITE (SUTURE) ×2 IMPLANT
SUT VIC AB 0 CT1 36 (SUTURE) ×2 IMPLANT
SUT VIC AB 2-0 CT2 27 (SUTURE) ×2 IMPLANT
SUT VICRYL 3-0 27IN (SUTURE) IMPLANT
SUTURE ETHLN 4-0 FS2 18XMF BLK (SUTURE) ×1 IMPLANT
SUTURE MNCRL 4-0 27XMF (SUTURE) IMPLANT
SYR 10ML LL (SYRINGE) ×3 IMPLANT
TAPE CLOTH 3X10 WHT NS LF (GAUZE/BANDAGES/DRESSINGS) ×3 IMPLANT
TAPE MICROFOAM 4IN (TAPE) ×1 IMPLANT
TUBING ARTHRO INFLOW-ONLY STRL (TUBING) ×3 IMPLANT
TUBING CONNECTING 10 (TUBING) ×2 IMPLANT
TUBING CONNECTING 10' (TUBING) ×1
WAND HAND CNTRL MULTIVAC 90 (MISCELLANEOUS) ×3 IMPLANT
WAND WEREWOLF FLOW 90D (MISCELLANEOUS) ×2 IMPLANT
WRAPON POLAR PAD SHDR XLG (MISCELLANEOUS) ×3

## 2018-02-15 NOTE — Transfer of Care (Signed)
Immediate Anesthesia Transfer of Care Note  Patient: Stephen Schmidt  Procedure(s) Performed: SHOULDER ARTHROSCOPY WITH OPEN ROTATOR CUFF REPAIR,SUBACROMINAL DECOMPRESSION,DISTAL CLAVICLE EXCISION,BICEPS TENODESIS, & SUBSCAPULARIS . (Right Shoulder)  Patient Location: PACU  Anesthesia Type:General  Level of Consciousness: sedated  Airway & Oxygen Therapy: Patient Spontanous Breathing and Patient connected to face mask oxygen  Post-op Assessment: Report given to RN and Post -op Vital signs reviewed and stable  Post vital signs: Reviewed and stable  Last Vitals:  Vitals Value Taken Time  BP    Temp    Pulse 63 02/15/2018  4:04 PM  Resp 16 02/15/2018  4:04 PM  SpO2 99 % 02/15/2018  4:04 PM  Vitals shown include unvalidated device data.  Last Pain:  Vitals:   02/15/18 0952  TempSrc: Tympanic  PainSc: 0-No pain         Complications: No apparent anesthesia complications

## 2018-02-15 NOTE — Anesthesia Postprocedure Evaluation (Signed)
Anesthesia Post Note  Patient: Stephen Schmidt  Procedure(s) Performed: SHOULDER ARTHROSCOPY WITH OPEN ROTATOR CUFF REPAIR,SUBACROMINAL DECOMPRESSION,DISTAL CLAVICLE EXCISION,BICEPS TENODESIS, & SUBSCAPULARIS . (Right Shoulder)  Patient location during evaluation: PACU Anesthesia Type: General Level of consciousness: awake and alert Pain management: pain level controlled Vital Signs Assessment: post-procedure vital signs reviewed and stable Respiratory status: spontaneous breathing and respiratory function stable Cardiovascular status: stable Anesthetic complications: no     Last Vitals:  Vitals:   02/15/18 1700 02/15/18 1743  BP: 133/63 134/66  Pulse: (!) 55 (!) 55  Resp: 18   Temp: (!) 36.1 C   SpO2: 98%     Last Pain:  Vitals:   02/15/18 1700  TempSrc:   PainSc: 0-No pain                 Jamone Garrido K

## 2018-02-15 NOTE — Anesthesia Procedure Notes (Signed)
Anesthesia Regional Block: Interscalene brachial plexus block   Pre-Anesthetic Checklist: ,, timeout performed, Correct Patient, Correct Site, Correct Laterality, Correct Procedure, Correct Position, site marked, Risks and benefits discussed,  Surgical consent,  Pre-op evaluation,  At surgeon's request and post-op pain management  Laterality: Right and Upper  Prep: chloraprep       Needles:  Injection technique: Single-shot  Needle Type: Stimiplex     Needle Length: 5cm  Needle Gauge: 22     Additional Needles:   Procedures:,,,, ultrasound used (permanent image in chart),,,,  Narrative:  Start time: 02/15/2018 10:24 AM End time: 02/15/2018 10:28 AM Injection made incrementally with aspirations every 5 mL.  Performed by: Personally  Anesthesiologist: Lenard SimmerKarenz, Bentlee Benningfield, MD  Additional Notes: Functioning IV was confirmed and monitors were applied.  A 50mm 22ga Stimuplex needle was used. Sterile prep and drape,hand hygiene and sterile gloves were used.  Negative aspiration and negative test dose prior to incremental administration of local anesthetic. The patient tolerated the procedure well.

## 2018-02-15 NOTE — Discharge Instructions (Signed)
AMBULATORY SURGERY  °DISCHARGE INSTRUCTIONS ° ° °1) The drugs that you were given will stay in your system until tomorrow so for the next 24 hours you should not: ° °A) Drive an automobile °B) Make any legal decisions °C) Drink any alcoholic beverage ° ° °2) You may resume regular meals tomorrow.  Today it is better to start with liquids and gradually work up to solid foods. ° °You may eat anything you prefer, but it is better to start with liquids, then soup and crackers, and gradually work up to solid foods. ° ° °3) Please notify your doctor immediately if you have any unusual bleeding, trouble breathing, redness and pain at the surgery site, drainage, fever, or pain not relieved by medication. °4)  ° °5) Your post-operative visit with Dr.                     °           °     is: Date:                        Time:   ° °Please call to schedule your post-operative visit. ° °6) Additional Instructions: ° ° ° ° ° ° °Post-Op Instructions - Rotator Cuff Repair ° °1. Bracing: You will wear a shoulder immobilizer or sling for 6 weeks.  ° °2. Driving: No driving for 3 weeks post-op. When driving, do not wear the immobilizer. Ideally, we recommend no driving for 6 weeks while sling is in place as one arm will be immobilized.  ° °3. Activity: No active lifting for 2 months. Wrist, hand, and elbow motion only. Avoid lifting the upper arm away from the body except for hygiene. You are permitted to bend and straighten the elbow passively only (no active elbow motion). You may use your hand and wrist for typing, writing, and managing utensils (cutting food). Do not lift more than a coffee cup for 8 weeks.  When sleeping or resting, inclined positions (recliner chair or wedge pillow) and a pillow under the forearm for support may provide better comfort for up to 4 weeks.  Avoid long distance travel for 4 weeks. ° °Return to normal activities after rotator cuff repair repair normally takes 6 months on average. If rehab goes very  well, may be able to do most activities at 4 months, except overhead or contact sports. ° °4. Physical Therapy: Begins 3-4 days after surgery, and proceed 1 time per week for the first 6 weeks, then 1-2 times per week from weeks 6-20 post-op. ° °5. Medications:  °- You will be provided a prescription for narcotic pain medicine. After surgery, take 1-2 narcotic tablets every 4 hours if needed for severe pain.  °- A prescription for anti-nausea medication will be provided in case the narcotic medicine causes nausea - take 1 tablet every 6 hours only if nauseated.   °- Take tylenol 1000 mg (2 Extra Strength tablets or 3 regular strength) every 8 hours for pain.  May decrease or stop tylenol 5 days after surgery if you are having minimal pain. °- Take ASA 325mg/day x 2 weeks to help prevent DVTs/PEs (blood clots).  °- DO NOT take ANY nonsteroidal anti-inflammatory pain medications (Advil, Motrin, Ibuprofen, Aleve, Naproxen, or Naprosyn). These medicines can inhibit healing of your shoulder repair.  ° ° °If you are taking prescription medication for anxiety, depression, insomnia, muscle spasm, chronic pain, or for attention deficit disorder, you   are advised that you are at a higher risk of adverse effects with use of narcotics post-op, including narcotic addiction/dependence, depressed breathing, death. °If you use non-prescribed substances: alcohol, marijuana, cocaine, heroin, methamphetamines, etc., you are at a higher risk of adverse effects with use of narcotics post-op, including narcotic addiction/dependence, depressed breathing, death. °You are advised that taking > 50 morphine milligram equivalents (MME) of narcotic pain medication per day results in twice the risk of overdose or death. For your prescription provided: oxycodone 5 mg - taking more than 6 tablets per day would result in > 50 morphine milligram equivalents (MME) of narcotic pain medication. °Be advised that we will prescribe narcotics short-term,  for acute post-operative pain only - 3 weeks for major operations such as shoulder repair/reconstruction surgeries.  ° ° ° °6. Post-Op Appointment: ° °Your first post-op appointment will be 10-14 days post-op. ° °7. Work or School: For most, but not all procedures, we advise staying out of work or school for at least 1 to 2 weeks in order to recover from the stress of surgery and to allow time for healing.  ° °If you need a work or school note this can be provided.  ° °8. Smoking: If you are a smoker, you need to refrain from smoking in the postoperative period. The nicotine in cigarettes will inhibit healing of your shoulder repair and decrease the chance of successful repair. Similarly, nicotine containing products (gum, patches) should be avoided.  ° °Post-operative Brace: °Apply and remove the brace you received as you were instructed to at the time of fitting and as described in detail as the brace’s instructions for use indicate.  Wear the brace for the period of time prescribed by your physician.  The brace can be cleaned with soap and water and allowed to air dry only.  Should the brace result in increased pain, decreased feeling (numbness/tingling), increased swelling or an overall worsening of your medical condition, please contact your doctor immediately.  If an emergency situation occurs as a result of wearing the brace after normal business hours, please dial 911 and seek immediate medical attention.  Let your doctor know if you have any further questions about the brace issued to you. °Refer to the shoulder sling instructions for use if you have any questions regarding the correct fit of your shoulder sling.  °[facility] [employer] for Troubleshooting: [phone] ° °Video that illustrates how to properly use a shoulder sling: °"Instructions for Proper Use of an Orthopaedic Sling" °https://www.youtube.com/watch?v=AHZpn_Xo45w ° ° ° ° ° °

## 2018-02-15 NOTE — H&P (Signed)
Paper H&P to be scanned into permanent record. H&P reviewed. No significant changes noted.  

## 2018-02-15 NOTE — Anesthesia Procedure Notes (Addendum)
Procedure Name: Intubation Date/Time: 02/15/2018 12:00 PM Performed by: Nelda Marseille, CRNA Pre-anesthesia Checklist: Patient identified, Patient being monitored, Timeout performed, Emergency Drugs available and Suction available Patient Re-evaluated:Patient Re-evaluated prior to induction Oxygen Delivery Method: Circle System Utilized Preoxygenation: Pre-oxygenation with 100% oxygen Induction Type: IV induction Ventilation: Mask ventilation without difficulty Laryngoscope Size: Mac and 4 Grade View: Grade III Tube type: Oral Tube size: 8.0 mm Number of attempts: 1 Airway Equipment and Method: Stylet and Video-laryngoscopy Placement Confirmation: ETT inserted through vocal cords under direct vision,  positive ETCO2 and breath sounds checked- equal and bilateral Secured at: 21 cm Tube secured with: Tape Dental Injury: Teeth and Oropharynx as per pre-operative assessment  Difficulty Due To: Difficulty was anticipated, Difficult Airway- due to large tongue and Difficult Airway- due to anterior larynx

## 2018-02-15 NOTE — OR Nursing (Signed)
Discharge instructions discussed with patient and daughter. Both voice understanding. 

## 2018-02-15 NOTE — Anesthesia Post-op Follow-up Note (Signed)
Anesthesia QCDR form completed.        

## 2018-02-15 NOTE — Anesthesia Preprocedure Evaluation (Signed)
Anesthesia Evaluation  Patient identified by MRN, date of birth, ID band Patient awake    Reviewed: Allergy & Precautions, H&P , NPO status , Patient's Chart, lab work & pertinent test results, reviewed documented beta blocker date and time   History of Anesthesia Complications Negative for: history of anesthetic complications  Airway Mallampati: I  TM Distance: >3 FB Neck ROM: full    Dental  (+) Dental Advidsory Given, Teeth Intact   Pulmonary neg pulmonary ROS,           Cardiovascular Exercise Tolerance: Good hypertension, (-) angina(-) CAD, (-) Past MI, (-) Cardiac Stents and (-) CABG (-) dysrhythmias (-) Valvular Problems/Murmurs     Neuro/Psych negative neurological ROS  negative psych ROS   GI/Hepatic negative GI ROS, Neg liver ROS,   Endo/Other  negative endocrine ROS  Renal/GU negative Renal ROS  negative genitourinary   Musculoskeletal   Abdominal   Peds  Hematology negative hematology ROS (+)   Anesthesia Other Findings Past Medical History: No date: Hyperlipidemia No date: Hypertension No date: Obesity, Class I, BMI 30.0-34.9 (see actual BMI)     Comment:  However notably lighter used to be.  Once 324 pounds in               1996   Reproductive/Obstetrics negative OB ROS                             Anesthesia Physical Anesthesia Plan  ASA: II  Anesthesia Plan: General   Post-op Pain Management:  Regional for Post-op pain   Induction: Intravenous  PONV Risk Score and Plan: 2 and Ondansetron, Dexamethasone, Midazolam, Promethazine and Treatment may vary due to age or medical condition  Airway Management Planned: Oral ETT  Additional Equipment:   Intra-op Plan:   Post-operative Plan: Extubation in OR  Informed Consent: I have reviewed the patients History and Physical, chart, labs and discussed the procedure including the risks, benefits and alternatives for the  proposed anesthesia with the patient or authorized representative who has indicated his/her understanding and acceptance.   Dental Advisory Given  Plan Discussed with: Anesthesiologist, CRNA and Surgeon  Anesthesia Plan Comments:         Anesthesia Quick Evaluation

## 2018-02-15 NOTE — Op Note (Signed)
SURGERY DATE: 02/15/2018  PRE-OP DIAGNOSIS:  1. Right rotator cuff tear (subscapularis, supraspinatus) 2. Right acromioclavicular joint osteoarthritis 3. Right subacromial impingement 4. Right biceps tendinopathy  POST-OP DIAGNOSIS: 1. Right rotator cuff tear (subscapularis, supraspinatus, infrapinatus) 2. Right acromioclavicular joint osteoarthritis 3. Right subacromial impingement 4. Right biceps tendinopathy  PROCEDURES:  1. Right arthroscopic rotator cuff repair (subscapularis) 2. Right mini-open rotator cuff repair (supraspinatus and infraspinatus) 3. Right open biceps tenodesis 4. Right arthroscopic distal clavicle excision 5. Right arthroscopic extensive debridement of shoulder (glenohumeral and subacromial spaces) 6. Right arthroscopic subacromial decompression  SURGEON: Rosealee AlbeeSunny H. Britiany Silbernagel, MD  ASSISTANT: Sonny DandyJ. Todd Mundy, PA  ANESTHESIA: Gen with Exparil interscalene block  ESTIMATED BLOOD LOSS: 25cc  DRAINS:  none  TOTAL IV FLUIDS: per anesthesia   SPECIMENS: none  IMPLANTS:  - Arthrex 4.2475mm SwiveLock x 4 - Arthrex FiberTak Suture Anchor - Double Loaded x2 - Iconix SPEED double loaded with 1.2 and 2.630mm tape x 2   OPERATIVE FINDINGS:  Examination under anesthesia: A careful examination under anesthesia was performed.  Passive range of motion was: FF: 160; ER at side: 50; ER in abduction: 90; IR in abduction: 50.  Anterior load shift: NT.  Posterior load shift: NT.  Sulcus in neutral: NT.  Sulcus in ER: NT.    Intra-operative findings: A thorough arthroscopic examination of the shoulder was performed.  The findings are: 1. Biceps tendon: Significant tendinopathy with erythema and thickening of the proximal biceps 2. Superior labrum: injected with surrounding synovitis 3. Posterior labrum and capsule: normal 4. Inferior capsule and inferior recess: normal 5. Glenoid cartilage surface: Focal area of grade 1-2 changes  6. Supraspinatus attachment: full-thickness  tear 7. Posterior rotator cuff attachment: Full-thickness tear of tear of anterior infraspinatus 8. Humeral head articular cartilage: normal 9. Rotator interval: significant synovitis 10: Subscapularis tendon: Partial-thickness tear of the superior fibers 11. Anterior labrum: degenerative 12. IGHL: Normal  OPERATIVE REPORT:   Indications for procedure: Mihail Jesse SansC Nieland is a 67 y.o. male with ~4-5 months of R shoulder pain that began without known traumatic event.  He has failed non-operative management including activity modification, physical therapy, and medical management without adequate relief of symptoms. Clinical exam and MRI were suggestive of rotator cuff tear including subscapularis, full-thickness supraspinatus, and infraspinatus tears; subacromial impingement; biceps tendinopathy; and acromioclavicular joint arthritis.  After discussion of risks, benefits, and alternatives to surgery, the patient elected to proceed.   Procedure in detail:  I identified Keymon C Kalbfleisch in the pre-operative holding area.  I marked the operative shoulder with my initials. I reviewed the risks and benefits of the proposed surgical intervention, and the patient (and/or patient's guardian) wished to proceed.  Anesthesia was then performed with an Exparil interscalene block.  The patient was transferred to the operative suite and placed in the beach chair position.    SCDs were placed on the lower extremities. Appropriate IV antibiotics were administered prior to incision. The operative upper extremity was then prepped and draped in standard fashion. A time out was performed confirming the correct extremity, correct patient, and correct procedure.   I then created a standard posterior portal with an 11 blade. The glenohumeral joint was easily entered with a blunt trochar and the arthroscope introduced. The findings of diagnostic arthroscopy are described above.  A standard anterior portal was made.  I debrided  degenerative tissue including the synovitic tissue about the rotator interval and IGHL as well as the anterior and superior labrum. I then coagulated the  inflamed synovium to obtain hemostasis and reduce the risk of post-operative swelling using an Arthrocare radiofrequency device.  The biceps tendon was cut at its insertion on the superior labrum with arthroscopic scissors.  The subscapularis tear was identified. The comma tissue indicating the superolateral border of the subscapularis was identified readily.  The tip of the coracoid as well as the conjoined tendon and coracoacromial ligaments were visualized after debriding rotator interval tissue.  Tissue about the subscapularis was released anteriorly, superiorly, and posteriorly to allow for improved mobilization.  The lesser tuberosity footprint was prepared with a combination of electrocautery and an arthroscopic curette.  Through the anterior portal, A Scorpion suture passing device was used to pass a FiberTape in a mattress fashion through the subscapularis tendon.  This was passed  through a 4.75 mm SwiveLock and placed into the lesser tuberosity at the footprint of the subscapularis tendon with the arm in a neutral position. This appropriately reduced the subscapularis tear.  The arm was then internally and externally rotated and the subscapularis was noted to move appropriately with rotation.  Next, the arthroscope was then introduced into the subacromial space. A direct lateral portal was created with an 11-blade after spinal needle localization. An extensive subacromial bursectomy was performed using a combination of the shaver and Arthrocare wand. The entire acromial undersurface was exposed and the CA ligament was subperiosteally elevated to expose the anterior acromial hook. A 5.71mm barrel burr was used to create a flat anterior and lateral aspect of the acromion, converting it from a Type 2 to a Type 1 acromion. Care was made to keep the  deltoid fascia intact.  I then turned my attention to the arthroscopic distal clavicle excision. I identified the acromioclavicular joint. Surrounding bursal tissue was debrided and the edges of the joint were identified. I used the 5.88mm barrel burr to remove the distal clavicle parallel to the edge of the acromion. I was able to fit two widths of the burr into the space between the distal clavicle and acromion, signifying that I had removed ~49mm of distal clavicle. This was confirmed by viewing anteriorly and introducing a probe with measuring marks from the lateral portal. Hemostasis was achieved with an Arthrocare wand. Fluid was evacuated from the shoulder.  The full-thickness supraspinatus and partial infraspinatus tear was visualized and decision was made to proceed with a mini open approach.  A longitudinal incision from the anterolateral acromion ~6cm in length was made overlying the raphe between the anterior and middle heads of the deltoid.  The raphe was identified and it was incised. The subacromial space was identified. Any remaining bursa was excised. The rotator cuff tear involving the supraspinatus and part of the infraspinatus was identified. It was a large U-shaped tear with apex retracted to the level just medial to the apex of the humeral head.  We then turned our attention to the biceps tenodesis. The arm was externally rotated.  The bicipital groove was identified.  A 15 blade was used to make a cut overlying the biceps tendon, and the tendon was removed using a right angle clamp.  The base of the bicipital groove was identified and cleared of soft tissue.  A FiberTak anchor was placed in the bicipital groove.  The biceps tendon was held at the appropriate amount of tension.  One set of sutures was passed through the biceps anchor with one limb passed in a simple fashion and the second limb passed in a simple plus locking stitch pattern.  This was repeated for the other set of sutures.   This construct allowed for shuttling the biceps tendon down to the bone.  The sutures were tied and cut.  The diseased portion of the proximal biceps was then excised.  The arm was then internally rotated.  The rotator cuff footprint was cleared of soft tissue and the footprint was smoothed and bleeding bone over the footprint was created with a rongeur.  Soft tissue was released both superiorly and inferiorly to the rotator cuff to allow for adequate mobilization.  The rotator cuff was able to be reduced almost completely with mild undercoverage of the footprint at the apex of the tear.  Two Iconix SPEED anchors were placed just lateral to the articular margin.  The rotator cuff was held in a reduced position and all limbs of suture were passed appropriately with a scorpion suture passer. Two SwiveLock anchors were placed for the lateral row anchors with one limb of each of the four medial row sutures passed through each anchor.  This allowed for adequate reapproximation and compression of the supraspinatus over its footprint.  However, the infraspinatus was not completely reduced.  An Arthrex knotless FiberTak was placed at the articular margin, posterior to the other 2 anchors.  All 4 strands of suture were passed through the posterior rotator cuff.  These sutures were then tied, reducing the infraspinatus down to its footprint.  These were then passed through another lateral row anchor that was placed inferior and slightly lateral to the previously placed posterior lateral row anchor.  Additionally, two 2-0 FiberWire sutures from the lateral row anchors were passed in a mattress fashion and subsequently tied to reduce dogears and allow for further reduction of the rotator cuff.  At this point, the rotator cuff now covered almost the entirety of the footprint with the exception being right at the apex of the tear, where there was partial coverage of the footprint. The construct was stable with external and  internal rotation.  The wound was thoroughly irrigated.  The deltoid split was closed with 0 Vicryl.  The subdermal layer was closed with 2-0 Vicryl.  The skin was closed with staples. The portals were closed with 3-0 Nylon. Xeroform was applied to the incisions. A sterile dressing was applied, followed by a Polar Care sleeve and a SlingShot shoulder immobilizer/sling. The patient was awakened from anesthesia without difficulty and was transferred to the PACU in stable condition.     COMPLICATIONS: none  DISPOSITION: plan for discharge home after recovery in PACU   POSTOPERATIVE PLAN: Remain in sling (except hygiene and elbow/wrist/hand RoM exercises as instructed by PT) x 6 weeks and NWB for this time. PT to begin 3-4 days after surgery.  Use large rotator cuff repair rehab protocol with subscapularis repair.  ASA 325mg  daily x 2 weeks for DVT ppx.

## 2018-02-16 ENCOUNTER — Encounter: Payer: Self-pay | Admitting: Orthopedic Surgery

## 2018-02-18 DIAGNOSIS — M25611 Stiffness of right shoulder, not elsewhere classified: Secondary | ICD-10-CM | POA: Diagnosis not present

## 2018-02-18 DIAGNOSIS — R29898 Other symptoms and signs involving the musculoskeletal system: Secondary | ICD-10-CM | POA: Diagnosis not present

## 2018-02-18 DIAGNOSIS — M25511 Pain in right shoulder: Secondary | ICD-10-CM | POA: Diagnosis not present

## 2018-02-18 DIAGNOSIS — Z9889 Other specified postprocedural states: Secondary | ICD-10-CM | POA: Diagnosis not present

## 2018-02-26 DIAGNOSIS — Z9889 Other specified postprocedural states: Secondary | ICD-10-CM | POA: Diagnosis not present

## 2018-02-26 DIAGNOSIS — M25511 Pain in right shoulder: Secondary | ICD-10-CM | POA: Diagnosis not present

## 2018-03-05 DIAGNOSIS — M25511 Pain in right shoulder: Secondary | ICD-10-CM | POA: Diagnosis not present

## 2018-03-10 DIAGNOSIS — L600 Ingrowing nail: Secondary | ICD-10-CM | POA: Diagnosis not present

## 2018-03-10 DIAGNOSIS — M2042 Other hammer toe(s) (acquired), left foot: Secondary | ICD-10-CM | POA: Diagnosis not present

## 2018-03-10 DIAGNOSIS — M2041 Other hammer toe(s) (acquired), right foot: Secondary | ICD-10-CM | POA: Diagnosis not present

## 2018-03-10 DIAGNOSIS — M779 Enthesopathy, unspecified: Secondary | ICD-10-CM | POA: Diagnosis not present

## 2018-03-11 DIAGNOSIS — M25511 Pain in right shoulder: Secondary | ICD-10-CM | POA: Diagnosis not present

## 2018-03-11 DIAGNOSIS — Z9889 Other specified postprocedural states: Secondary | ICD-10-CM | POA: Diagnosis not present

## 2018-03-15 DIAGNOSIS — Z9889 Other specified postprocedural states: Secondary | ICD-10-CM | POA: Diagnosis not present

## 2018-03-19 DIAGNOSIS — M25511 Pain in right shoulder: Secondary | ICD-10-CM | POA: Diagnosis not present

## 2018-03-19 DIAGNOSIS — Z9889 Other specified postprocedural states: Secondary | ICD-10-CM | POA: Diagnosis not present

## 2018-03-23 DIAGNOSIS — M25511 Pain in right shoulder: Secondary | ICD-10-CM | POA: Diagnosis not present

## 2018-03-23 DIAGNOSIS — Z9889 Other specified postprocedural states: Secondary | ICD-10-CM | POA: Diagnosis not present

## 2018-03-31 DIAGNOSIS — M25511 Pain in right shoulder: Secondary | ICD-10-CM | POA: Diagnosis not present

## 2018-03-31 DIAGNOSIS — Z9889 Other specified postprocedural states: Secondary | ICD-10-CM | POA: Diagnosis not present

## 2018-04-02 DIAGNOSIS — Z9889 Other specified postprocedural states: Secondary | ICD-10-CM | POA: Diagnosis not present

## 2018-04-02 DIAGNOSIS — M25511 Pain in right shoulder: Secondary | ICD-10-CM | POA: Diagnosis not present

## 2018-04-07 DIAGNOSIS — M25511 Pain in right shoulder: Secondary | ICD-10-CM | POA: Diagnosis not present

## 2018-04-07 DIAGNOSIS — Z9889 Other specified postprocedural states: Secondary | ICD-10-CM | POA: Diagnosis not present

## 2018-04-09 DIAGNOSIS — Z9889 Other specified postprocedural states: Secondary | ICD-10-CM | POA: Diagnosis not present

## 2018-04-09 DIAGNOSIS — M25511 Pain in right shoulder: Secondary | ICD-10-CM | POA: Diagnosis not present

## 2018-04-12 DIAGNOSIS — M25511 Pain in right shoulder: Secondary | ICD-10-CM | POA: Diagnosis not present

## 2018-04-12 DIAGNOSIS — Z9889 Other specified postprocedural states: Secondary | ICD-10-CM | POA: Diagnosis not present

## 2018-04-14 DIAGNOSIS — M25511 Pain in right shoulder: Secondary | ICD-10-CM | POA: Diagnosis not present

## 2018-04-20 DIAGNOSIS — M25511 Pain in right shoulder: Secondary | ICD-10-CM | POA: Diagnosis not present

## 2018-04-20 DIAGNOSIS — Z9889 Other specified postprocedural states: Secondary | ICD-10-CM | POA: Diagnosis not present

## 2018-04-22 DIAGNOSIS — Z9889 Other specified postprocedural states: Secondary | ICD-10-CM | POA: Diagnosis not present

## 2018-04-22 DIAGNOSIS — M25511 Pain in right shoulder: Secondary | ICD-10-CM | POA: Diagnosis not present

## 2018-04-28 DIAGNOSIS — M25511 Pain in right shoulder: Secondary | ICD-10-CM | POA: Diagnosis not present

## 2018-04-28 DIAGNOSIS — M25561 Pain in right knee: Secondary | ICD-10-CM | POA: Diagnosis not present

## 2018-04-28 DIAGNOSIS — Z9889 Other specified postprocedural states: Secondary | ICD-10-CM | POA: Diagnosis not present

## 2018-04-28 DIAGNOSIS — M25562 Pain in left knee: Secondary | ICD-10-CM | POA: Diagnosis not present

## 2018-04-30 DIAGNOSIS — M25511 Pain in right shoulder: Secondary | ICD-10-CM | POA: Diagnosis not present

## 2018-04-30 DIAGNOSIS — Z9889 Other specified postprocedural states: Secondary | ICD-10-CM | POA: Diagnosis not present

## 2018-05-03 DIAGNOSIS — Z9889 Other specified postprocedural states: Secondary | ICD-10-CM | POA: Diagnosis not present

## 2018-05-03 DIAGNOSIS — M25511 Pain in right shoulder: Secondary | ICD-10-CM | POA: Diagnosis not present

## 2018-05-05 DIAGNOSIS — M25511 Pain in right shoulder: Secondary | ICD-10-CM | POA: Diagnosis not present

## 2018-05-05 DIAGNOSIS — Z9889 Other specified postprocedural states: Secondary | ICD-10-CM | POA: Diagnosis not present

## 2018-05-06 DIAGNOSIS — M25562 Pain in left knee: Secondary | ICD-10-CM | POA: Diagnosis not present

## 2018-05-06 DIAGNOSIS — M1712 Unilateral primary osteoarthritis, left knee: Secondary | ICD-10-CM | POA: Diagnosis not present

## 2018-05-06 DIAGNOSIS — M25561 Pain in right knee: Secondary | ICD-10-CM | POA: Diagnosis not present

## 2018-05-06 DIAGNOSIS — M1711 Unilateral primary osteoarthritis, right knee: Secondary | ICD-10-CM | POA: Diagnosis not present

## 2018-05-06 DIAGNOSIS — M7051 Other bursitis of knee, right knee: Secondary | ICD-10-CM | POA: Diagnosis not present

## 2018-05-06 DIAGNOSIS — M25461 Effusion, right knee: Secondary | ICD-10-CM | POA: Diagnosis not present

## 2018-05-12 DIAGNOSIS — M25511 Pain in right shoulder: Secondary | ICD-10-CM | POA: Diagnosis not present

## 2018-05-12 DIAGNOSIS — Z9889 Other specified postprocedural states: Secondary | ICD-10-CM | POA: Diagnosis not present

## 2018-05-12 DIAGNOSIS — M25541 Pain in joints of right hand: Secondary | ICD-10-CM | POA: Diagnosis not present

## 2018-05-12 DIAGNOSIS — M25542 Pain in joints of left hand: Secondary | ICD-10-CM | POA: Diagnosis not present

## 2018-05-14 ENCOUNTER — Other Ambulatory Visit: Payer: Self-pay | Admitting: Orthopedic Surgery

## 2018-05-14 DIAGNOSIS — M25541 Pain in joints of right hand: Secondary | ICD-10-CM

## 2018-05-14 DIAGNOSIS — M25511 Pain in right shoulder: Secondary | ICD-10-CM | POA: Diagnosis not present

## 2018-05-14 DIAGNOSIS — Z9889 Other specified postprocedural states: Secondary | ICD-10-CM | POA: Diagnosis not present

## 2018-05-20 ENCOUNTER — Ambulatory Visit: Admission: RE | Admit: 2018-05-20 | Payer: PPO | Source: Ambulatory Visit

## 2018-05-21 DIAGNOSIS — M25511 Pain in right shoulder: Secondary | ICD-10-CM | POA: Diagnosis not present

## 2018-05-21 DIAGNOSIS — Z9889 Other specified postprocedural states: Secondary | ICD-10-CM | POA: Diagnosis not present

## 2018-05-24 DIAGNOSIS — M25511 Pain in right shoulder: Secondary | ICD-10-CM | POA: Diagnosis not present

## 2018-05-24 DIAGNOSIS — Z9889 Other specified postprocedural states: Secondary | ICD-10-CM | POA: Diagnosis not present

## 2018-05-28 DIAGNOSIS — N35912 Unspecified bulbous urethral stricture, male: Secondary | ICD-10-CM | POA: Diagnosis not present

## 2018-05-28 DIAGNOSIS — Z9889 Other specified postprocedural states: Secondary | ICD-10-CM | POA: Diagnosis not present

## 2018-06-10 DIAGNOSIS — Z9889 Other specified postprocedural states: Secondary | ICD-10-CM | POA: Diagnosis not present

## 2018-06-10 DIAGNOSIS — M25511 Pain in right shoulder: Secondary | ICD-10-CM | POA: Diagnosis not present

## 2018-06-17 DIAGNOSIS — Z9889 Other specified postprocedural states: Secondary | ICD-10-CM | POA: Diagnosis not present

## 2018-06-17 DIAGNOSIS — M25511 Pain in right shoulder: Secondary | ICD-10-CM | POA: Diagnosis not present

## 2018-06-22 DIAGNOSIS — M25511 Pain in right shoulder: Secondary | ICD-10-CM | POA: Diagnosis not present

## 2018-06-22 DIAGNOSIS — Z9889 Other specified postprocedural states: Secondary | ICD-10-CM | POA: Diagnosis not present

## 2018-06-24 DIAGNOSIS — Z9889 Other specified postprocedural states: Secondary | ICD-10-CM | POA: Diagnosis not present

## 2018-06-24 DIAGNOSIS — M25511 Pain in right shoulder: Secondary | ICD-10-CM | POA: Diagnosis not present

## 2018-06-29 DIAGNOSIS — Z9889 Other specified postprocedural states: Secondary | ICD-10-CM | POA: Diagnosis not present

## 2018-06-29 DIAGNOSIS — M25511 Pain in right shoulder: Secondary | ICD-10-CM | POA: Diagnosis not present

## 2018-07-01 DIAGNOSIS — Z9889 Other specified postprocedural states: Secondary | ICD-10-CM | POA: Diagnosis not present

## 2018-07-01 DIAGNOSIS — M25511 Pain in right shoulder: Secondary | ICD-10-CM | POA: Diagnosis not present

## 2018-07-06 DIAGNOSIS — Z9889 Other specified postprocedural states: Secondary | ICD-10-CM | POA: Diagnosis not present

## 2018-07-06 DIAGNOSIS — M25511 Pain in right shoulder: Secondary | ICD-10-CM | POA: Diagnosis not present

## 2018-07-08 DIAGNOSIS — Z9889 Other specified postprocedural states: Secondary | ICD-10-CM | POA: Diagnosis not present

## 2018-07-13 DIAGNOSIS — Z9889 Other specified postprocedural states: Secondary | ICD-10-CM | POA: Diagnosis not present

## 2018-07-13 DIAGNOSIS — M25511 Pain in right shoulder: Secondary | ICD-10-CM | POA: Diagnosis not present

## 2018-08-11 DIAGNOSIS — I10 Essential (primary) hypertension: Secondary | ICD-10-CM | POA: Diagnosis not present

## 2018-08-11 DIAGNOSIS — E7800 Pure hypercholesterolemia, unspecified: Secondary | ICD-10-CM | POA: Diagnosis not present

## 2018-08-12 DIAGNOSIS — I10 Essential (primary) hypertension: Secondary | ICD-10-CM | POA: Diagnosis not present

## 2018-08-12 DIAGNOSIS — Z862 Personal history of diseases of the blood and blood-forming organs and certain disorders involving the immune mechanism: Secondary | ICD-10-CM | POA: Diagnosis not present

## 2018-08-12 DIAGNOSIS — E7800 Pure hypercholesterolemia, unspecified: Secondary | ICD-10-CM | POA: Diagnosis not present

## 2018-08-18 ENCOUNTER — Other Ambulatory Visit: Payer: Self-pay

## 2018-08-18 ENCOUNTER — Telehealth: Payer: Self-pay | Admitting: Oncology

## 2018-08-18 NOTE — Telephone Encounter (Signed)
Spoke with pt to confirm appt date/time, do pre-appt screen which was completed, and adv of Covid-19 guidelines for appt regarding screening questions, temperature check, face mask required, and no visitors allowed °

## 2018-08-19 ENCOUNTER — Other Ambulatory Visit: Payer: Self-pay

## 2018-08-19 ENCOUNTER — Encounter: Payer: Self-pay | Admitting: Oncology

## 2018-08-19 ENCOUNTER — Inpatient Hospital Stay: Payer: PPO | Attending: Oncology | Admitting: Oncology

## 2018-08-19 ENCOUNTER — Inpatient Hospital Stay: Payer: PPO

## 2018-08-19 VITALS — BP 124/71 | HR 67 | Temp 98.0°F | Resp 18 | Ht 77.0 in | Wt 240.6 lb

## 2018-08-19 DIAGNOSIS — D649 Anemia, unspecified: Secondary | ICD-10-CM | POA: Insufficient documentation

## 2018-08-19 DIAGNOSIS — D696 Thrombocytopenia, unspecified: Secondary | ICD-10-CM

## 2018-08-19 DIAGNOSIS — D72819 Decreased white blood cell count, unspecified: Secondary | ICD-10-CM | POA: Insufficient documentation

## 2018-08-19 LAB — CBC WITH DIFFERENTIAL/PLATELET
Abs Immature Granulocytes: 0 10*3/uL (ref 0.00–0.07)
Basophils Absolute: 0 10*3/uL (ref 0.0–0.1)
Basophils Relative: 0 %
Eosinophils Absolute: 0.1 10*3/uL (ref 0.0–0.5)
Eosinophils Relative: 2 %
HCT: 37.6 % — ABNORMAL LOW (ref 39.0–52.0)
Hemoglobin: 12.6 g/dL — ABNORMAL LOW (ref 13.0–17.0)
Immature Granulocytes: 0 %
Lymphocytes Relative: 40 %
Lymphs Abs: 1.3 10*3/uL (ref 0.7–4.0)
MCH: 27.7 pg (ref 26.0–34.0)
MCHC: 33.5 g/dL (ref 30.0–36.0)
MCV: 82.6 fL (ref 80.0–100.0)
Monocytes Absolute: 0.4 10*3/uL (ref 0.1–1.0)
Monocytes Relative: 12 %
Neutro Abs: 1.6 10*3/uL — ABNORMAL LOW (ref 1.7–7.7)
Neutrophils Relative %: 46 %
Platelets: 135 10*3/uL — ABNORMAL LOW (ref 150–400)
RBC: 4.55 MIL/uL (ref 4.22–5.81)
RDW: 13.2 % (ref 11.5–15.5)
WBC: 3.3 10*3/uL — ABNORMAL LOW (ref 4.0–10.5)
nRBC: 0 % (ref 0.0–0.2)

## 2018-08-19 LAB — TECHNOLOGIST SMEAR REVIEW: Plt Morphology: NORMAL

## 2018-08-19 LAB — IRON AND TIBC
Iron: 82 ug/dL (ref 45–182)
Saturation Ratios: 35 % (ref 17.9–39.5)
TIBC: 232 ug/dL — ABNORMAL LOW (ref 250–450)
UIBC: 150 ug/dL

## 2018-08-19 LAB — FOLATE: Folate: 12.6 ng/mL (ref >5.9)

## 2018-08-19 LAB — FERRITIN: Ferritin: 159 ng/mL (ref 24–336)

## 2018-08-19 LAB — VITAMIN B12: Vitamin B-12: 862 pg/mL (ref 180–914)

## 2018-08-19 NOTE — Progress Notes (Signed)
Patient here for initial evaluation.  °

## 2018-08-21 NOTE — Progress Notes (Addendum)
Hematology/Oncology Consult note [facility] [phone] [phone]   Patient Care Team: Marisue IvanLinthavong, Kanhka, MD as PCP - General (Family Medicine)  REFERRING PROVIDER: Marisue IvanLinthavong, Kanhka, MD  CHIEF COMPLAINTS/REASON FOR VISIT:  Evaluation of thrombocytopenia  HISTORY OF PRESENTING ILLNESS:  Stephen Schmidt is a  68 y.o.  male with PMH listed below was seen in consultation at the request of  Marisue IvanLinthavong, Kanhka, MD  for evaluation of pancytopenia.  Reviewed patient's labs.  Labs showed decreased platelet counts at 1 35,000, hemoglobin 13.1, WBC 3.2, Reviewed patient's previous labs.  Anemia started 6 months ago.  02/02/2018 labs showed hemoglobin 12.8, normal WBC 4.3, normal platelet count 155. No aggravating or elevated factors.  Associated symptoms or signs:  Denies weight loss, fever, chills, fatigue, night sweats.  Denies hematochezia, hematuria, hematemesis, epistaxis, black tarry stool.  easy bruising.   Context:  History of hepatitis or HIV infection,  History of gastric bypass History of autoimmune disease.   Alcohol use, patient drinks wine or beer to 2 times a week. Admit to drink tonic water with quinine for the past few years for the treatment of bilateral lower extremity cramps. Quinine tonic water helps relieving his lower extremity cramping symptoms.  Review of Systems  Constitutional: Negative for appetite change, chills, fatigue, fever and unexpected weight change.  HENT:   Negative for hearing loss and voice change.   Eyes: Negative for eye problems and icterus.  Respiratory: Negative for chest tightness, cough and shortness of breath.   Cardiovascular: Negative for chest pain and leg swelling.  Gastrointestinal: Negative for abdominal distention and abdominal pain.  Endocrine: Negative for hot flashes.  Genitourinary: Negative for difficulty urinating, dysuria and frequency.   Musculoskeletal: Negative for  arthralgias.  Skin: Negative for itching and rash.  Neurological: Negative for light-headedness and numbness.  Hematological: Negative for adenopathy. Does not bruise/bleed easily.  Psychiatric/Behavioral: Negative for confusion.    MEDICAL HISTORY:  Past Medical History:  Diagnosis Date  . Hyperlipidemia   . Hypertension   . Obesity, Class I, BMI 30.0-34.9 (see actual BMI)    However notably lighter used to be.  Once 324 pounds in 1996    SURGICAL HISTORY: Past Surgical History:  Procedure Laterality Date  . SHOULDER ARTHROSCOPY WITH OPEN ROTATOR CUFF REPAIR Right 02/15/2018   Procedure: SHOULDER ARTHROSCOPY WITH OPEN ROTATOR CUFF REPAIR,SUBACROMINAL DECOMPRESSION,DISTAL CLAVICLE EXCISION,BICEPS TENODESIS, & SUBSCAPULARIS .;  Surgeon: Signa KellPatel, Sunny, MD;  Location: [facility] ORS;  Service: Orthopedics;  Laterality: Right;  . TONSILLECTOMY      SOCIAL HISTORY: Social History   Socioeconomic History  . Marital status: Married    Spouse name: Not on file  . Number of children: Not on file  . Years of education: Not on file  . Highest education level: Not on file  Occupational History  . Occupation: self employed  Social Needs  . Financial resource strain: Not on file  . Food insecurity    Worry: Not on file    Inability: Not on file  . Transportation needs    Medical: Not on file    Non-medical: Not on file  Tobacco Use  . Smoking status: Never Smoker  . Smokeless tobacco: Never Used  Substance and Sexual Activity  . Alcohol use: Yes    Comment: wine from time to time  . Drug use: No  . Sexual activity: Yes  Lifestyle  . Physical activity    Days per week: Not on file    Minutes per  session: Not on file  . Stress: Not on file  Relationships  . Social Musicianconnections    Talks on phone: Not on file    Gets together: Not on file    Attends religious service: Not on file    Active member of club or organization: Not on file    Attends meetings of clubs or organizations:  Not on file    Relationship status: Not on file  . Intimate partner violence    Fear of current or ex partner: Not on file    Emotionally abused: Not on file    Physically abused: Not on file    Forced sexual activity: Not on file  Other Topics Concern  . Not on file  Social History Narrative   Married with 3 children.  Wife has signs of dementia.   Rare alcohol.  No cigarettes.  Good diet.   Routinely exercises doing bicycle "spinning "does it 3-4 days a week for up to 45 minutes at a time.   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    FAMILY HISTORY: Family History  Problem Relation Age of Onset  . Heart failure Mother   . Dementia Mother   . Kidney disease Sister   . Heart attack Cousin 60    ALLERGIES:  has No Known Allergies.  MEDICATIONS:  Current Outpatient Medications  Medication Sig Dispense Refill  . Apple Cider Vinegar 600 MG CAPS Take 600 mg by mouth daily.    . Ascorbic Acid (VITAMIN C ADULT GUMMIES PO) Take 2 each by mouth daily.    . benazepril (LOTENSIN) 40 MG tablet Take 40 mg by mouth daily.  1  . Cinnamon 500 MG TABS Take 1,000 mg by mouth daily.    . Coenzyme Q10 (COQ10 GUMMIES ADULT PO) Take 2 each by mouth daily.    . hydrochlorothiazide (HYDRODIURIL) 12.5 MG tablet Take 12.5 mg by mouth daily.     . Misc Natural Products (HERBAL ENERGY COMPLEX PO) Take 2 each by mouth daily.    . Multiple Vitamins-Minerals (MULTIVITAMIN ADULT) CHEW Chew 1 each by mouth daily.    Stephen Schmidt Kitchen. OVER THE COUNTER MEDICATION Take 1 capsule by mouth daily. Chaga Mushroom Supplement    . pravastatin (PRAVACHOL) 20 MG tablet Take 20 mg by mouth daily.    Glory Rosebush. Shark Cartilage 740 MG CAPS Take 1,480 mg by mouth 2 (two) times daily.    . Turmeric 500 MG CAPS Take 1,000 mg by mouth 2 (two) times daily.    Stephen Schmidt Kitchen. acetaminophen (TYLENOL) 500 MG tablet Take 2 tablets (1,000 mg total) by mouth every 8 (eight) hours. (Patient not taking: Reported on 08/19/2018) 90 tablet 2  .  lidocaine (LMX) 4 % cream Apply 1 application topically daily as needed (for arm pain).    . Lidocaine 4 % PTCH Place 1 patch onto the skin at bedtime. Remove & Discard patch within 12 hours or as directed by MD    . ondansetron (ZOFRAN ODT) 4 MG disintegrating tablet Take 1 tablet (4 mg total) by mouth every 8 (eight) hours as needed for nausea or vomiting. (Patient not taking: Reported on 08/19/2018) 20 tablet 0  . oxyCODONE (ROXICODONE) 5 MG immediate release tablet Take 1-2 tablets (5-10 mg total) by mouth every 4 (four) hours as needed (pain). (Patient not taking: Reported on 08/19/2018) 30 tablet 0   No current facility-administered medications for this visit.      PHYSICAL EXAMINATION: ECOG PERFORMANCE STATUS: 0 -  Asymptomatic Vitals:   08/19/18 1347  BP: 124/71  Pulse: 67  Resp: 18  Temp: 98 F (36.7 C)   Filed Weights   08/19/18 1347  Weight: 240 lb 9.6 oz (109.1 kg)    Physical Exam Constitutional:      General: He is not in acute distress. HENT:     Head: Normocephalic and atraumatic.  Eyes:     General: No scleral icterus.    Pupils: Pupils are equal, round, and reactive to light.  Neck:     Musculoskeletal: Normal range of motion and neck supple.  Cardiovascular:     Rate and Rhythm: Normal rate and regular rhythm.     Heart sounds: Normal heart sounds.  Pulmonary:     Effort: Pulmonary effort is normal. No respiratory distress.     Breath sounds: No wheezing.  Abdominal:     General: Bowel sounds are normal. There is no distension.     Palpations: Abdomen is soft. There is no mass.     Tenderness: There is no abdominal tenderness.  Musculoskeletal: Normal range of motion.        General: No deformity.  Skin:    General: Skin is warm and dry.     Findings: No erythema or rash.  Neurological:     Mental Status: He is alert and oriented to person, place, and time.     Cranial Nerves: No cranial nerve deficit.     Coordination: Coordination normal.   Psychiatric:        Behavior: Behavior normal.        Thought Content: Thought content normal.      LABORATORY DATA:  I have reviewed the data as listed Lab Results  Component Value Date   WBC 3.3 (L) 08/19/2018   HGB 12.6 (L) 08/19/2018   HCT 37.6 (L) 08/19/2018   MCV 82.6 08/19/2018   PLT 135 (L) 08/19/2018   No results for input(s): NA, K, CL, CO2, GLUCOSE, BUN, CREATININE, CALCIUM, GFRNONAA, GFRAA, PROT, ALBUMIN, AST, ALT, ALKPHOS, BILITOT, BILIDIR, IBILI in the last 8760 hours. Iron/TIBC/Ferritin/ %Sat    Component Value Date/Time   IRON 82 08/19/2018 1423   TIBC 232 (L) 08/19/2018 1423   FERRITIN 159 08/19/2018 1423   IRONPCTSAT 35 08/19/2018 1423     RADIOGRAPHIC STUDIES: I have personally reviewed the radiological images as listed and agreed with the findings in the report. 12/24/2017 shoulder MRI 1.  Complete tear of supraspinatus tendon with 3.5 cm of retraction. 2. Moderate tendinosis of the infraspinatus tendon with a small full-thickness tear of the anterior fibers. 3. Severe tendinosis of the subscapularis tendon with a partial-thickness tear. 4. Severe tendinosis of the intra-articular portion of the long head of the biceps tendon.  ASSESSMENT & PLAN:  1. Thrombocytopenia ([place])   2. Anemia, unspecified type   3. Leukopenia, unspecified type    Pancytopenia, discussed with patient that pancytopenia can be secondary to drug or diet induced, for example tonic water quinine can cause bone marrow suppression. Other etiology includes alcohol consumption, vitamin deficiency, chronic inflammation, or underlying bone marrow process. I will check CBC, smear, vitamin B12, folate, iron TIBC and ferritin.  Advised patient to stop using tonic water quinine.  We will repeat blood work in 8 weeks to see if any improvement of his blood work # Patient follow-up with me in approximately 3 months  to review the above results.  Labs are reviewed and discussed.   Orders  Placed This Encounter  Procedures  .  CBC with Differential/Platelet    Standing Status:   Future    Number of Occurrences:   1    Standing Expiration Date:   08/19/2019  . Technologist smear review    Standing Status:   Future    Number of Occurrences:   1    Standing Expiration Date:   08/19/2019  . Vitamin B12    Standing Status:   Future    Number of Occurrences:   1    Standing Expiration Date:   08/19/2019  . Folate    Standing Status:   Future    Number of Occurrences:   1    Standing Expiration Date:   08/19/2019  . Iron and TIBC    Standing Status:   Future    Number of Occurrences:   1    Standing Expiration Date:   08/19/2019  . Ferritin    Standing Status:   Future    Number of Occurrences:   1    Standing Expiration Date:   08/19/2019    All questions were answered. The patient knows to call the clinic with any problems questions or concerns.  Return of visit: 8 weeks Thank you for this kind referral and the opportunity to participate in the care of this patient. A copy of today's note is routed to referring provider   Cc Marisue IvanLinthavong, Kanhka, MD  Rickard PatienceZhou Layken Beg, MD, PhD Hematology Oncology [facility] Pager- [phone] 08/21/2018

## 2018-09-22 DIAGNOSIS — L600 Ingrowing nail: Secondary | ICD-10-CM | POA: Diagnosis not present

## 2018-09-22 DIAGNOSIS — M2041 Other hammer toe(s) (acquired), right foot: Secondary | ICD-10-CM | POA: Diagnosis not present

## 2018-09-22 DIAGNOSIS — M2042 Other hammer toe(s) (acquired), left foot: Secondary | ICD-10-CM | POA: Diagnosis not present

## 2018-09-22 DIAGNOSIS — M779 Enthesopathy, unspecified: Secondary | ICD-10-CM | POA: Diagnosis not present

## 2018-10-13 ENCOUNTER — Inpatient Hospital Stay (HOSPITAL_BASED_OUTPATIENT_CLINIC_OR_DEPARTMENT_OTHER): Payer: PPO | Admitting: Oncology

## 2018-10-13 ENCOUNTER — Other Ambulatory Visit: Payer: Self-pay

## 2018-10-13 ENCOUNTER — Inpatient Hospital Stay: Payer: PPO | Attending: Oncology

## 2018-10-13 ENCOUNTER — Encounter: Payer: Self-pay | Admitting: Oncology

## 2018-10-13 VITALS — BP 111/71 | HR 65 | Temp 98.5°F | Resp 16 | Wt 235.4 lb

## 2018-10-13 DIAGNOSIS — E785 Hyperlipidemia, unspecified: Secondary | ICD-10-CM | POA: Diagnosis not present

## 2018-10-13 DIAGNOSIS — D649 Anemia, unspecified: Secondary | ICD-10-CM

## 2018-10-13 DIAGNOSIS — D61818 Other pancytopenia: Secondary | ICD-10-CM | POA: Insufficient documentation

## 2018-10-13 DIAGNOSIS — Z9884 Bariatric surgery status: Secondary | ICD-10-CM | POA: Diagnosis not present

## 2018-10-13 DIAGNOSIS — Z79899 Other long term (current) drug therapy: Secondary | ICD-10-CM | POA: Insufficient documentation

## 2018-10-13 DIAGNOSIS — D696 Thrombocytopenia, unspecified: Secondary | ICD-10-CM

## 2018-10-13 DIAGNOSIS — D72819 Decreased white blood cell count, unspecified: Secondary | ICD-10-CM

## 2018-10-13 DIAGNOSIS — I10 Essential (primary) hypertension: Secondary | ICD-10-CM | POA: Insufficient documentation

## 2018-10-13 LAB — CBC WITH DIFFERENTIAL/PLATELET
Abs Immature Granulocytes: 0.01 10*3/uL (ref 0.00–0.07)
Basophils Absolute: 0 10*3/uL (ref 0.0–0.1)
Basophils Relative: 0 %
Eosinophils Absolute: 0.2 10*3/uL (ref 0.0–0.5)
Eosinophils Relative: 4 %
HCT: 38 % — ABNORMAL LOW (ref 39.0–52.0)
Hemoglobin: 12.6 g/dL — ABNORMAL LOW (ref 13.0–17.0)
Immature Granulocytes: 0 %
Lymphocytes Relative: 35 %
Lymphs Abs: 1.5 10*3/uL (ref 0.7–4.0)
MCH: 27.8 pg (ref 26.0–34.0)
MCHC: 33.2 g/dL (ref 30.0–36.0)
MCV: 83.7 fL (ref 80.0–100.0)
Monocytes Absolute: 0.5 10*3/uL (ref 0.1–1.0)
Monocytes Relative: 12 %
Neutro Abs: 2.2 10*3/uL (ref 1.7–7.7)
Neutrophils Relative %: 49 %
Platelets: 148 10*3/uL — ABNORMAL LOW (ref 150–400)
RBC: 4.54 MIL/uL (ref 4.22–5.81)
RDW: 13.9 % (ref 11.5–15.5)
WBC: 4.4 10*3/uL (ref 4.0–10.5)
nRBC: 0 % (ref 0.0–0.2)

## 2018-10-13 NOTE — Progress Notes (Signed)
Patient here today for follow up. Patient denies any decrease in appetite, nausea, vomiting, diarrhea, constipation or pain.  

## 2018-10-13 NOTE — Progress Notes (Signed)
Hematology/Oncology Consult note [facility] Telephone:([phone]) [phone] [phone]   Patient Care Team: Stephen Schmidt, Kanhka, MD as PCP - General (Family Medicine)  REFERRING PROVIDER: Marisue Schmidt, Kanhka, MD  CHIEF COMPLAINTS/REASON FOR VISIT:  Evaluation of thrombocytopenia  HISTORY OF PRESENTING ILLNESS:  Stephen Schmidt is a  68 y.o.  male with PMH listed below was seen in consultation at the request of  Stephen Schmidt, Kanhka, MD  for evaluation of pancytopenia.  Reviewed patient's labs.  Labs showed decreased platelet counts at 1 35,000, hemoglobin 13.1, WBC 3.2, Reviewed patient's previous labs.  Anemia started 6 months ago.  02/02/2018 labs showed hemoglobin 12.8, normal WBC 4.3, normal platelet count 155. No aggravating or elevated factors.  Associated symptoms or signs:  Denies weight loss, fever, chills, fatigue, night sweats.  Denies hematochezia, hematuria, hematemesis, epistaxis, black tarry stool.  easy bruising.   Context:  History of hepatitis or HIV infection,  History of gastric bypass History of autoimmune disease.   Alcohol use, patient drinks wine or beer to 2 times a week. Admit to drink tonic water with quinine for the past few years for the treatment of bilateral lower extremity cramps. Quinine tonic water helps relieving his lower extremity cramping symptoms.  INTERVAL HISTORY Stephen Schmidt is a 68 y.o. male who has above history reviewed by me today presents for follow up visit for management of thrombocytopenia. Problems and complaints are listed below: At last visit we have discussed about potential side effects of tonic water with quinine which he has stopped. Reports feeling well at baseline. No additional leg cramps .  No new complaints today . Review of Systems  Constitutional: Negative for appetite change, chills, fatigue, fever and unexpected weight change.  HENT:   Negative for hearing loss and voice change.   Eyes:  Negative for eye problems and icterus.  Respiratory: Negative for chest tightness, cough and shortness of breath.   Cardiovascular: Negative for chest pain and leg swelling.  Gastrointestinal: Negative for abdominal distention and abdominal pain.  Endocrine: Negative for hot flashes.  Genitourinary: Negative for difficulty urinating, dysuria and frequency.   Musculoskeletal: Negative for arthralgias.  Skin: Negative for itching and rash.  Neurological: Negative for light-headedness and numbness.  Hematological: Negative for adenopathy. Does not bruise/bleed easily.  Psychiatric/Behavioral: Negative for confusion.    MEDICAL HISTORY:  Past Medical History:  Diagnosis Date  . Hyperlipidemia   . Hypertension   . Obesity, Class I, BMI 30.0-34.9 (see actual BMI)    However notably lighter used to be.  Once 324 pounds in 1996    SURGICAL HISTORY: Past Surgical History:  Procedure Laterality Date  . SHOULDER ARTHROSCOPY WITH OPEN ROTATOR CUFF REPAIR Right 02/15/2018   Procedure: SHOULDER ARTHROSCOPY WITH OPEN ROTATOR CUFF REPAIR,SUBACROMINAL DECOMPRESSION,DISTAL CLAVICLE EXCISION,BICEPS TENODESIS, & SUBSCAPULARIS .;  Surgeon: Signa KellPatel, Sunny, MD;  Location: [facility] ORS;  Service: Orthopedics;  Laterality: Right;  . TONSILLECTOMY      SOCIAL HISTORY: Social History   Socioeconomic History  . Marital status: Married    Spouse name: Not on file  . Number of children: Not on file  . Years of education: Not on file  . Highest education level: Not on file  Occupational History  . Occupation: self employed  Social Needs  . Financial resource strain: Not on file  . Food insecurity    Worry: Not on file    Inability: Not on file  . Transportation needs    Medical: Not on file  Non-medical: Not on file  Tobacco Use  . Smoking status: Never Smoker  . Smokeless tobacco: Never Used  Substance and Sexual Activity  . Alcohol use: Yes    Comment: wine from time to time  . Drug use: No  .  Sexual activity: Yes  Lifestyle  . Physical activity    Days per week: Not on file    Minutes per session: Not on file  . Stress: Not on file  Relationships  . Social Herbalist on phone: Not on file    Gets together: Not on file    Attends religious service: Not on file    Active member of club or organization: Not on file    Attends meetings of clubs or organizations: Not on file    Relationship status: Not on file  . Intimate partner violence    Fear of current or ex partner: Not on file    Emotionally abused: Not on file    Physically abused: Not on file    Forced sexual activity: Not on file  Other Topics Concern  . Not on file  Social History Narrative   Married with 3 children.  Wife has signs of dementia.   Rare alcohol.  No cigarettes.  Good diet.   Routinely exercises doing bicycle "spinning "does it 3-4 days a week for up to 45 minutes at a time.   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    FAMILY HISTORY: Family History  Problem Relation Age of Onset  . Heart failure Mother   . Dementia Mother   . Kidney disease Sister   . Heart attack Cousin 60    ALLERGIES:  has No Known Allergies.  MEDICATIONS:  Current Outpatient Medications  Medication Sig Dispense Refill  . acetaminophen (TYLENOL) 500 MG tablet Take 2 tablets (1,000 mg total) by mouth every 8 (eight) hours. 90 tablet 2  . Apple Cider Vinegar 600 MG CAPS Take 600 mg by mouth daily.    . Ascorbic Acid (VITAMIN C ADULT GUMMIES PO) Take 2 each by mouth daily.    . benazepril (LOTENSIN) 40 MG tablet Take 40 mg by mouth daily.  1  . Cinnamon 500 MG TABS Take 1,000 mg by mouth daily.    . Coenzyme Q10 (COQ10 GUMMIES ADULT PO) Take 2 each by mouth daily.    . hydrochlorothiazide (HYDRODIURIL) 12.5 MG tablet Take 12.5 mg by mouth daily.     . Misc Natural Products (HERBAL ENERGY COMPLEX PO) Take 2 each by mouth daily.    . Multiple Vitamins-Minerals (MULTIVITAMIN ADULT)  CHEW Chew 1 each by mouth daily.    Marland Kitchen OVER THE COUNTER MEDICATION Take 1 capsule by mouth daily. Chaga Mushroom Supplement    . pravastatin (PRAVACHOL) 20 MG tablet Take 20 mg by mouth daily.    Berniece Pap Cartilage 740 MG CAPS Take 1,480 mg by mouth 2 (two) times daily.    . Turmeric 500 MG CAPS Take 1,000 mg by mouth 2 (two) times daily.     No current facility-administered medications for this visit.      PHYSICAL EXAMINATION: ECOG PERFORMANCE STATUS: 0 - Asymptomatic Vitals:   10/13/18 1356  BP: 111/71  Pulse: 65  Resp: 16  Temp: 98.5 F (36.9 C)   Filed Weights   10/13/18 1356  Weight: 235 lb 6 oz (106.8 kg)    Physical Exam Constitutional:      General: He is not in acute  distress. HENT:     Head: Normocephalic and atraumatic.  Eyes:     General: No scleral icterus.    Pupils: Pupils are equal, round, and reactive to light.  Neck:     Musculoskeletal: Normal range of motion and neck supple.  Cardiovascular:     Rate and Rhythm: Normal rate and regular rhythm.     Heart sounds: Normal heart sounds.  Pulmonary:     Effort: Pulmonary effort is normal. No respiratory distress.     Breath sounds: No wheezing.  Abdominal:     General: Bowel sounds are normal. There is no distension.     Palpations: Abdomen is soft. There is no mass.     Tenderness: There is no abdominal tenderness.  Musculoskeletal: Normal range of motion.        General: No deformity.  Skin:    General: Skin is warm and dry.     Findings: No erythema or rash.  Neurological:     Mental Status: He is alert and oriented to person, place, and time.     Cranial Nerves: No cranial nerve deficit.     Coordination: Coordination normal.  Psychiatric:        Behavior: Behavior normal.        Thought Content: Thought content normal.      LABORATORY DATA:  I have reviewed the data as listed Lab Results  Component Value Date   WBC 4.4 10/13/2018   HGB 12.6 (L) 10/13/2018   HCT 38.0 (L) 10/13/2018    MCV 83.7 10/13/2018   PLT 148 (L) 10/13/2018   No results for input(s): NA, K, CL, CO2, GLUCOSE, BUN, CREATININE, CALCIUM, GFRNONAA, GFRAA, PROT, ALBUMIN, AST, ALT, ALKPHOS, BILITOT, BILIDIR, IBILI in the last 8760 hours. Iron/TIBC/Ferritin/ %Sat    Component Value Date/Time   IRON 82 08/19/2018 1423   TIBC 232 (L) 08/19/2018 1423   FERRITIN 159 08/19/2018 1423   IRONPCTSAT 35 08/19/2018 1423     RADIOGRAPHIC STUDIES: I have personally reviewed the radiological images as listed and agreed with the findings in the report. 12/24/2017 shoulder MRI 1.  Complete tear of supraspinatus tendon with 3.5 cm of retraction. 2. Moderate tendinosis of the infraspinatus tendon with a small full-thickness tear of the anterior fibers. 3. Severe tendinosis of the subscapularis tendon with a partial-thickness tear. 4. Severe tendinosis of the intra-articular portion of the long head of the biceps tendon.  ASSESSMENT & PLAN:  1. Thrombocytopenia ([facility])    Labs reviewed and discussed with patient. Leukopenia has completely resolved. Normocytic anemia, stable hemoglobin. Thrombocytopenia, platelet level has improved. Discussed with patient that previous pancytopenia most likely secondary to quinine. Recommend observation.  Repeat blood work in 6 months. If he has persistent anemic, will trigger anemia work-up at that point. Patient agreed with the plan.  Orders Placed This Encounter  Procedures  . Comprehensive metabolic panel    Standing Status:   Future    Standing Expiration Date:   10/13/2019  . CBC with Differential/Platelet    Standing Status:   Future    Standing Expiration Date:   10/13/2019    All questions were answered. The patient knows to call the clinic with any problems questions or concerns.  Return of visit: 6 months  Cc Stephen Schmidt, Kanhka, MD  Rickard PatienceZhou Kordelia Severin, MD, PhD 10/13/2018

## 2018-12-29 DIAGNOSIS — Z23 Encounter for immunization: Secondary | ICD-10-CM | POA: Diagnosis not present

## 2019-01-05 DIAGNOSIS — M778 Other enthesopathies, not elsewhere classified: Secondary | ICD-10-CM | POA: Diagnosis not present

## 2019-01-05 DIAGNOSIS — M2041 Other hammer toe(s) (acquired), right foot: Secondary | ICD-10-CM | POA: Diagnosis not present

## 2019-01-05 DIAGNOSIS — L600 Ingrowing nail: Secondary | ICD-10-CM | POA: Diagnosis not present

## 2019-01-05 DIAGNOSIS — M2042 Other hammer toe(s) (acquired), left foot: Secondary | ICD-10-CM | POA: Diagnosis not present

## 2019-01-24 DIAGNOSIS — M1711 Unilateral primary osteoarthritis, right knee: Secondary | ICD-10-CM | POA: Diagnosis not present

## 2019-01-24 DIAGNOSIS — M25561 Pain in right knee: Secondary | ICD-10-CM | POA: Diagnosis not present

## 2019-01-24 DIAGNOSIS — G8929 Other chronic pain: Secondary | ICD-10-CM | POA: Diagnosis not present

## 2019-01-24 DIAGNOSIS — M25461 Effusion, right knee: Secondary | ICD-10-CM | POA: Diagnosis not present

## 2019-02-15 DIAGNOSIS — I10 Essential (primary) hypertension: Secondary | ICD-10-CM | POA: Diagnosis not present

## 2019-02-15 DIAGNOSIS — E7800 Pure hypercholesterolemia, unspecified: Secondary | ICD-10-CM | POA: Diagnosis not present

## 2019-02-15 DIAGNOSIS — Z0000 Encounter for general adult medical examination without abnormal findings: Secondary | ICD-10-CM | POA: Diagnosis not present

## 2019-04-12 ENCOUNTER — Other Ambulatory Visit: Payer: PPO

## 2019-04-12 ENCOUNTER — Ambulatory Visit: Payer: PPO | Admitting: Oncology

## 2019-04-14 ENCOUNTER — Other Ambulatory Visit: Payer: Self-pay

## 2019-04-14 ENCOUNTER — Inpatient Hospital Stay: Payer: PPO | Attending: Oncology

## 2019-04-14 DIAGNOSIS — I10 Essential (primary) hypertension: Secondary | ICD-10-CM | POA: Insufficient documentation

## 2019-04-14 DIAGNOSIS — E669 Obesity, unspecified: Secondary | ICD-10-CM | POA: Diagnosis not present

## 2019-04-14 DIAGNOSIS — R7989 Other specified abnormal findings of blood chemistry: Secondary | ICD-10-CM | POA: Diagnosis not present

## 2019-04-14 DIAGNOSIS — D72819 Decreased white blood cell count, unspecified: Secondary | ICD-10-CM | POA: Insufficient documentation

## 2019-04-14 DIAGNOSIS — Z8249 Family history of ischemic heart disease and other diseases of the circulatory system: Secondary | ICD-10-CM | POA: Insufficient documentation

## 2019-04-14 DIAGNOSIS — D696 Thrombocytopenia, unspecified: Secondary | ICD-10-CM | POA: Insufficient documentation

## 2019-04-14 DIAGNOSIS — E785 Hyperlipidemia, unspecified: Secondary | ICD-10-CM | POA: Diagnosis not present

## 2019-04-14 DIAGNOSIS — Z79899 Other long term (current) drug therapy: Secondary | ICD-10-CM | POA: Insufficient documentation

## 2019-04-14 LAB — COMPREHENSIVE METABOLIC PANEL
ALT: 16 U/L (ref 0–44)
AST: 26 U/L (ref 15–41)
Albumin: 4.4 g/dL (ref 3.5–5.0)
Alkaline Phosphatase: 48 U/L (ref 38–126)
Anion gap: 8 (ref 5–15)
BUN: 34 mg/dL — ABNORMAL HIGH (ref 8–23)
CO2: 32 mmol/L (ref 22–32)
Calcium: 9.5 mg/dL (ref 8.9–10.3)
Chloride: 103 mmol/L (ref 98–111)
Creatinine, Ser: 1.34 mg/dL — ABNORMAL HIGH (ref 0.61–1.24)
GFR calc Af Amer: >60 mL/min (ref >60)
GFR calc non Af Amer: 55 mL/min — ABNORMAL LOW (ref >60)
Glucose, Bld: 89 mg/dL (ref 70–99)
Potassium: 4.5 mmol/L (ref 3.5–5.1)
Sodium: 143 mmol/L (ref 135–145)
Total Bilirubin: 0.8 mg/dL (ref 0.3–1.2)
Total Protein: 7.9 g/dL (ref 6.5–8.1)

## 2019-04-14 LAB — CBC WITH DIFFERENTIAL/PLATELET
Abs Immature Granulocytes: 0 10*3/uL (ref 0.00–0.07)
Basophils Absolute: 0 10*3/uL (ref 0.0–0.1)
Basophils Relative: 0 %
Eosinophils Absolute: 0.1 10*3/uL (ref 0.0–0.5)
Eosinophils Relative: 2 %
HCT: 43.1 % (ref 39.0–52.0)
Hemoglobin: 14 g/dL (ref 13.0–17.0)
Immature Granulocytes: 0 %
Lymphocytes Relative: 44 %
Lymphs Abs: 1.4 10*3/uL (ref 0.7–4.0)
MCH: 28 pg (ref 26.0–34.0)
MCHC: 32.5 g/dL (ref 30.0–36.0)
MCV: 86.2 fL (ref 80.0–100.0)
Monocytes Absolute: 0.4 10*3/uL (ref 0.1–1.0)
Monocytes Relative: 13 %
Neutro Abs: 1.3 10*3/uL — ABNORMAL LOW (ref 1.7–7.7)
Neutrophils Relative %: 41 %
Platelets: 157 10*3/uL (ref 150–400)
RBC: 5 MIL/uL (ref 4.22–5.81)
RDW: 13.3 % (ref 11.5–15.5)
WBC: 3.1 10*3/uL — ABNORMAL LOW (ref 4.0–10.5)
nRBC: 0 % (ref 0.0–0.2)

## 2019-04-15 ENCOUNTER — Encounter: Payer: Self-pay | Admitting: Oncology

## 2019-04-15 ENCOUNTER — Inpatient Hospital Stay (HOSPITAL_BASED_OUTPATIENT_CLINIC_OR_DEPARTMENT_OTHER): Payer: PPO | Admitting: Oncology

## 2019-04-15 DIAGNOSIS — R7989 Other specified abnormal findings of blood chemistry: Secondary | ICD-10-CM

## 2019-04-15 DIAGNOSIS — D72819 Decreased white blood cell count, unspecified: Secondary | ICD-10-CM

## 2019-04-15 DIAGNOSIS — D696 Thrombocytopenia, unspecified: Secondary | ICD-10-CM | POA: Diagnosis not present

## 2019-04-15 NOTE — Progress Notes (Signed)
Patient contacted for follow up Mychart appt. No new concerns voiced.

## 2019-04-15 NOTE — Progress Notes (Signed)
HEMATOLOGY-ONCOLOGY TeleHEALTH VISIT PROGRESS NOTE  I connected with Stephen Schmidt on 04/15/19 at  2:15 PM EST by video enabled telemedicine visit and verified that I am speaking with the correct person using two identifiers. I discussed the limitations, risks, security and privacy concerns of performing an evaluation and management service by telemedicine and the availability of in-person appointments. I also discussed with the patient that there may be a patient responsible charge related to this service. The patient expressed understanding and agreed to proceed.   Other persons participating in the visit and their role in the encounter:  None  Patient's location: Home  Provider's location: office Chief Complaint: follow up for thrombocytopenia.    INTERVAL HISTORY Stephen Schmidt is a 69 y.o. male who has above history reviewed by me today presents for follow up visit for thrombocytopenia Problems and complaints are listed below:  Patient has no new complaints.  He has had first dose of COVID 19 vaccination. Tolerates well.   Review of Systems  Constitutional: Negative for appetite change, chills, fatigue, fever and unexpected weight change.  HENT:   Negative for hearing loss and voice change.   Eyes: Negative for eye problems and icterus.  Respiratory: Negative for chest tightness, cough and shortness of breath.   Cardiovascular: Negative for chest pain and leg swelling.  Gastrointestinal: Negative for abdominal distention and abdominal pain.  Endocrine: Negative for hot flashes.  Genitourinary: Negative for difficulty urinating, dysuria and frequency.   Musculoskeletal: Negative for arthralgias.  Skin: Negative for itching and rash.  Neurological: Negative for light-headedness and numbness.  Hematological: Negative for adenopathy. Does not bruise/bleed easily.  Psychiatric/Behavioral: Negative for confusion.    Past Medical History:  Diagnosis Date  . Hyperlipidemia   .  Hypertension   . Obesity, Class I, BMI 30.0-34.9 (see actual BMI)    However notably lighter used to be.  Once 324 pounds in 1996   Past Surgical History:  Procedure Laterality Date  . SHOULDER ARTHROSCOPY WITH OPEN ROTATOR CUFF REPAIR Right 02/15/2018   Procedure: SHOULDER ARTHROSCOPY WITH OPEN ROTATOR CUFF REPAIR,SUBACROMINAL DECOMPRESSION,DISTAL CLAVICLE EXCISION,BICEPS TENODESIS, & SUBSCAPULARIS .;  Surgeon: Signa Kell, MD;  Location: [facility] ORS;  Service: Orthopedics;  Laterality: Right;  . TONSILLECTOMY      Family History  Problem Relation Age of Onset  . Heart failure Mother   . Dementia Mother   . Kidney disease Sister   . Heart attack Cousin 60    Social History   Socioeconomic History  . Marital status: Married    Spouse name: Not on file  . Number of children: Not on file  . Years of education: Not on file  . Highest education level: Not on file  Occupational History  . Occupation: self employed  Tobacco Use  . Smoking status: Never Smoker  . Smokeless tobacco: Never Used  Substance and Sexual Activity  . Alcohol use: Yes    Comment: wine from time to time  . Drug use: No  . Sexual activity: Yes  Other Topics Concern  . Not on file  Social History Narrative   Married with 3 children.  Wife has signs of dementia.   Rare alcohol.  No cigarettes.  Good diet.   Routinely exercises doing bicycle "spinning "does it 3-4 days a week for up to 45 minutes at a time.   He is self-employed in a [employer] fast streamer.   Social Determinants of Health   Financial Resource Strain:   .  Difficulty of Paying Living Expenses: Not on file  Food Insecurity:   . Worried About Programme researcher, broadcasting/film/video in the Last Year: Not on file  . Ran Out of Food in the Last Year: Not on file  Transportation Needs:   . Lack of Transportation (Medical): Not on file  . Lack of Transportation (Non-Medical): Not on file  Physical Activity:   . Days of Exercise per Week: Not on  file  . Minutes of Exercise per Session: Not on file  Stress:   . Feeling of Stress : Not on file  Social Connections:   . Frequency of Communication with Friends and Family: Not on file  . Frequency of Social Gatherings with Friends and Family: Not on file  . Attends Religious Services: Not on file  . Active Member of Clubs or Organizations: Not on file  . Attends Banker Meetings: Not on file  . Marital Status: Not on file  Intimate Partner Violence:   . Fear of Current or Ex-Partner: Not on file  . Emotionally Abused: Not on file  . Physically Abused: Not on file  . Sexually Abused: Not on file    Current Outpatient Medications on File Prior to Visit  Medication Sig Dispense Refill  . Apple Cider Vinegar 600 MG CAPS Take 600 mg by mouth daily.    . Ascorbic Acid (VITAMIN C ADULT GUMMIES PO) Take 2 each by mouth daily.    . benazepril (LOTENSIN) 40 MG tablet Take 40 mg by mouth daily.  1  . Calcium-Phosphorus-Vitamin D (CALCIUM GUMMIES) 250-100-500 MG-MG-UNIT CHEW     . Cinnamon 500 MG TABS Take 1,000 mg by mouth daily.    . Coenzyme Q10 (COQ10 GUMMIES ADULT PO) Take 2 each by mouth daily.    . hydrochlorothiazide (HYDRODIURIL) 12.5 MG tablet Take 12.5 mg by mouth daily.     . Misc Natural Products (HERBAL ENERGY COMPLEX PO) Take 2 each by mouth daily.    . Multiple Vitamins-Minerals (MULTIVITAMIN ADULT) CHEW Chew 1 each by mouth daily.    . Omega-3 Fatty Acids (FISH OIL ADULT GUMMIES PO)     . OVER THE COUNTER MEDICATION Take 1 capsule by mouth daily. Chaga Mushroom Supplement    . pravastatin (PRAVACHOL) 20 MG tablet Take 20 mg by mouth daily.    Glory Rose[place]ush Cartilage 740 MG CAPS Take 1,480 mg by mouth 2 (two) times daily.    . Turmeric 500 MG CAPS Take 1,000 mg by mouth 2 (two) times daily.    . vitamin B-12 (CYANOCOBALAMIN) 500 MCG tablet Take by mouth.     No current facility-administered medications on file prior to visit.    No Known Allergies      Observations/Objective: There were no vitals filed for this visit. There is no height or weight on file to calculate BMI.  Physical Exam  Constitutional: No distress.  Neurological: He is alert.    CBC    Component Value Date/Time   WBC 3.1 (L) 04/14/2019 1120   RBC 5.00 04/14/2019 1120   HGB 14.0 04/14/2019 1120   HCT 43.1 04/14/2019 1120   PLT 157 04/14/2019 1120   MCV 86.2 04/14/2019 1120   MCH 28.0 04/14/2019 1120   MCHC 32.5 04/14/2019 1120   RDW 13.3 04/14/2019 1120   LYMPHSABS 1.4 04/14/2019 1120   MONOABS 0.4 04/14/2019 1120   EOSABS 0.1 04/14/2019 1120   BASOSABS 0.0 04/14/2019 1120    CMP     Component Value  Date/Time   NA 143 04/14/2019 1120   K 4.5 04/14/2019 1120   CL 103 04/14/2019 1120   CO2 32 04/14/2019 1120   GLUCOSE 89 04/14/2019 1120   BUN 34 (H) 04/14/2019 1120   CREATININE 1.34 (H) 04/14/2019 1120   CALCIUM 9.5 04/14/2019 1120   PROT 7.9 04/14/2019 1120   ALBUMIN 4.4 04/14/2019 1120   AST 26 04/14/2019 1120   ALT 16 04/14/2019 1120   ALKPHOS 48 04/14/2019 1120   BILITOT 0.8 04/14/2019 1120   GFRNONAA 55 (L) 04/14/2019 1120   GFRAA >60 04/14/2019 1120     Assessment and Plan: 1. Thrombocytopenia (HCC)   2. Leukopenia, unspecified type   3. Elevated serum creatinine     # Thrombocytopenia resolved.  # Leukopenia, intermittent, neutropenia. Intermittent. Likely reactive or ethinic neutropenia. He is asymptomatic, I will hold off work up for now and continue observe.  # Slightly elevated creatinine, no urinary symptoms. Advise patient to increase oral hydration.    Follow Up Instructions: 3 months.    I discussed the assessment and treatment plan with the patient. The patient was provided an opportunity to ask questions and all were answered. The patient agreed with the plan and demonstrated an understanding of the instructions.  The patient was advised to call back or seek an in-person evaluation if the symptoms worsen or if the  condition fails to improve as anticipated.    Earlie Server, MD 04/15/2019 9:55 PM

## 2019-07-13 ENCOUNTER — Other Ambulatory Visit: Payer: Self-pay

## 2019-07-13 ENCOUNTER — Inpatient Hospital Stay: Payer: PPO | Attending: Oncology

## 2019-07-13 DIAGNOSIS — D696 Thrombocytopenia, unspecified: Secondary | ICD-10-CM | POA: Diagnosis not present

## 2019-07-13 DIAGNOSIS — D72819 Decreased white blood cell count, unspecified: Secondary | ICD-10-CM | POA: Insufficient documentation

## 2019-07-13 LAB — BASIC METABOLIC PANEL
Anion gap: 8 (ref 5–15)
BUN: 31 mg/dL — ABNORMAL HIGH (ref 8–23)
CO2: 29 mmol/L (ref 22–32)
Calcium: 9.2 mg/dL (ref 8.9–10.3)
Chloride: 104 mmol/L (ref 98–111)
Creatinine, Ser: 1.36 mg/dL — ABNORMAL HIGH (ref 0.61–1.24)
GFR calc Af Amer: >60 mL/min (ref >60)
GFR calc non Af Amer: 54 mL/min — ABNORMAL LOW (ref >60)
Glucose, Bld: 92 mg/dL (ref 70–99)
Potassium: 4 mmol/L (ref 3.5–5.1)
Sodium: 141 mmol/L (ref 135–145)

## 2019-07-13 LAB — CBC WITH DIFFERENTIAL/PLATELET
Abs Immature Granulocytes: 0.01 10*3/uL (ref 0.00–0.07)
Basophils Absolute: 0 10*3/uL (ref 0.0–0.1)
Basophils Relative: 0 %
Eosinophils Absolute: 0.1 10*3/uL (ref 0.0–0.5)
Eosinophils Relative: 3 %
HCT: 38.3 % — ABNORMAL LOW (ref 39.0–52.0)
Hemoglobin: 13.3 g/dL (ref 13.0–17.0)
Immature Granulocytes: 0 %
Lymphocytes Relative: 33 %
Lymphs Abs: 1.4 10*3/uL (ref 0.7–4.0)
MCH: 28.5 pg (ref 26.0–34.0)
MCHC: 34.7 g/dL (ref 30.0–36.0)
MCV: 82 fL (ref 80.0–100.0)
Monocytes Absolute: 0.4 10*3/uL (ref 0.1–1.0)
Monocytes Relative: 11 %
Neutro Abs: 2.2 10*3/uL (ref 1.7–7.7)
Neutrophils Relative %: 53 %
Platelets: 132 10*3/uL — ABNORMAL LOW (ref 150–400)
RBC: 4.67 MIL/uL (ref 4.22–5.81)
RDW: 13.3 % (ref 11.5–15.5)
WBC: 4.2 10*3/uL (ref 4.0–10.5)
nRBC: 0 % (ref 0.0–0.2)

## 2019-08-25 DIAGNOSIS — M778 Other enthesopathies, not elsewhere classified: Secondary | ICD-10-CM | POA: Diagnosis not present

## 2019-08-25 DIAGNOSIS — M898X9 Other specified disorders of bone, unspecified site: Secondary | ICD-10-CM | POA: Diagnosis not present

## 2019-08-25 DIAGNOSIS — M2041 Other hammer toe(s) (acquired), right foot: Secondary | ICD-10-CM | POA: Diagnosis not present

## 2019-08-25 DIAGNOSIS — M2042 Other hammer toe(s) (acquired), left foot: Secondary | ICD-10-CM | POA: Diagnosis not present

## 2019-08-25 DIAGNOSIS — I10 Essential (primary) hypertension: Secondary | ICD-10-CM | POA: Diagnosis not present

## 2019-08-25 DIAGNOSIS — L600 Ingrowing nail: Secondary | ICD-10-CM | POA: Diagnosis not present

## 2019-08-25 DIAGNOSIS — E7800 Pure hypercholesterolemia, unspecified: Secondary | ICD-10-CM | POA: Diagnosis not present

## 2019-10-13 ENCOUNTER — Other Ambulatory Visit: Payer: Self-pay

## 2019-10-13 ENCOUNTER — Inpatient Hospital Stay: Payer: PPO | Attending: Oncology

## 2019-10-13 DIAGNOSIS — Z8249 Family history of ischemic heart disease and other diseases of the circulatory system: Secondary | ICD-10-CM | POA: Insufficient documentation

## 2019-10-13 DIAGNOSIS — Z79899 Other long term (current) drug therapy: Secondary | ICD-10-CM | POA: Diagnosis not present

## 2019-10-13 DIAGNOSIS — I129 Hypertensive chronic kidney disease with stage 1 through stage 4 chronic kidney disease, or unspecified chronic kidney disease: Secondary | ICD-10-CM | POA: Diagnosis not present

## 2019-10-13 DIAGNOSIS — D696 Thrombocytopenia, unspecified: Secondary | ICD-10-CM | POA: Diagnosis not present

## 2019-10-13 DIAGNOSIS — D72819 Decreased white blood cell count, unspecified: Secondary | ICD-10-CM | POA: Diagnosis not present

## 2019-10-13 DIAGNOSIS — E669 Obesity, unspecified: Secondary | ICD-10-CM | POA: Insufficient documentation

## 2019-10-13 DIAGNOSIS — E785 Hyperlipidemia, unspecified: Secondary | ICD-10-CM | POA: Insufficient documentation

## 2019-10-13 DIAGNOSIS — N1831 Chronic kidney disease, stage 3a: Secondary | ICD-10-CM | POA: Diagnosis not present

## 2019-10-13 LAB — CBC WITH DIFFERENTIAL/PLATELET
Abs Immature Granulocytes: 0 10*3/uL (ref 0.00–0.07)
Basophils Absolute: 0 10*3/uL (ref 0.0–0.1)
Basophils Relative: 0 %
Eosinophils Absolute: 0.1 10*3/uL (ref 0.0–0.5)
Eosinophils Relative: 3 %
HCT: 38.4 % — ABNORMAL LOW (ref 39.0–52.0)
Hemoglobin: 13.6 g/dL (ref 13.0–17.0)
Immature Granulocytes: 0 %
Lymphocytes Relative: 33 %
Lymphs Abs: 1.3 10*3/uL (ref 0.7–4.0)
MCH: 28.8 pg (ref 26.0–34.0)
MCHC: 35.4 g/dL (ref 30.0–36.0)
MCV: 81.2 fL (ref 80.0–100.0)
Monocytes Absolute: 0.5 10*3/uL (ref 0.1–1.0)
Monocytes Relative: 13 %
Neutro Abs: 2 10*3/uL (ref 1.7–7.7)
Neutrophils Relative %: 51 %
Platelets: 145 10*3/uL — ABNORMAL LOW (ref 150–400)
RBC: 4.73 MIL/uL (ref 4.22–5.81)
RDW: 13.5 % (ref 11.5–15.5)
WBC: 3.9 10*3/uL — ABNORMAL LOW (ref 4.0–10.5)
nRBC: 0 % (ref 0.0–0.2)

## 2019-10-13 LAB — COMPREHENSIVE METABOLIC PANEL
ALT: 12 U/L (ref 0–44)
AST: 21 U/L (ref 15–41)
Albumin: 4 g/dL (ref 3.5–5.0)
Alkaline Phosphatase: 47 U/L (ref 38–126)
Anion gap: 7 (ref 5–15)
BUN: 29 mg/dL — ABNORMAL HIGH (ref 8–23)
CO2: 30 mmol/L (ref 22–32)
Calcium: 9.2 mg/dL (ref 8.9–10.3)
Chloride: 104 mmol/L (ref 98–111)
Creatinine, Ser: 1.45 mg/dL — ABNORMAL HIGH (ref 0.61–1.24)
GFR calc Af Amer: 58 mL/min — ABNORMAL LOW (ref >60)
GFR calc non Af Amer: 50 mL/min — ABNORMAL LOW (ref >60)
Glucose, Bld: 92 mg/dL (ref 70–99)
Potassium: 4.4 mmol/L (ref 3.5–5.1)
Sodium: 141 mmol/L (ref 135–145)
Total Bilirubin: 0.7 mg/dL (ref 0.3–1.2)
Total Protein: 7.3 g/dL (ref 6.5–8.1)

## 2019-10-14 ENCOUNTER — Inpatient Hospital Stay (HOSPITAL_BASED_OUTPATIENT_CLINIC_OR_DEPARTMENT_OTHER): Payer: PPO | Admitting: Oncology

## 2019-10-14 ENCOUNTER — Encounter: Payer: Self-pay | Admitting: Oncology

## 2019-10-14 DIAGNOSIS — N1831 Chronic kidney disease, stage 3a: Secondary | ICD-10-CM | POA: Diagnosis not present

## 2019-10-14 DIAGNOSIS — D696 Thrombocytopenia, unspecified: Secondary | ICD-10-CM | POA: Diagnosis not present

## 2019-10-14 DIAGNOSIS — D72819 Decreased white blood cell count, unspecified: Secondary | ICD-10-CM

## 2019-10-14 NOTE — Progress Notes (Signed)
HEMATOLOGY-ONCOLOGY TeleHEALTH VISIT PROGRESS NOTE  I connected with Stephen Schmidt on 10/14/19 at  1:45 PM EDT by video enabled telemedicine visit and verified that I am speaking with the correct person using two identifiers. I discussed the limitations, risks, security and privacy concerns of performing an evaluation and management service by telemedicine and the availability of in-person appointments. I also discussed with the patient that there may be a patient responsible charge related to this service. The patient expressed understanding and agreed to proceed.   Other persons participating in the visit and their role in the encounter:  None  Patient's location: Home  Provider's location: office Chief Complaint: follow up for thrombocytopenia.    INTERVAL HISTORY Stephen Schmidt is a 69 y.o. male who has above history reviewed by me today presents for follow up visit for thrombocytopenia Problems and complaints are listed below:  Denies any new complaints today.   Review of Systems  Constitutional: Negative for appetite change, chills, fatigue, fever and unexpected weight change.  HENT:   Negative for hearing loss and voice change.   Eyes: Negative for eye problems and icterus.  Respiratory: Negative for chest tightness, cough and shortness of breath.   Cardiovascular: Negative for chest pain and leg swelling.  Gastrointestinal: Negative for abdominal distention and abdominal pain.  Endocrine: Negative for hot flashes.  Genitourinary: Negative for difficulty urinating, dysuria and frequency.   Musculoskeletal: Negative for arthralgias.  Skin: Negative for itching and rash.  Neurological: Negative for light-headedness and numbness.  Hematological: Negative for adenopathy. Does not bruise/bleed easily.  Psychiatric/Behavioral: Negative for confusion.    Past Medical History:  Diagnosis Date  . Hyperlipidemia   . Hypertension   . Obesity, Class I, BMI 30.0-34.9 (see actual BMI)     However notably lighter used to be.  Once 324 pounds in 1996   Past Surgical History:  Procedure Laterality Date  . SHOULDER ARTHROSCOPY WITH OPEN ROTATOR CUFF REPAIR Right 02/15/2018   Procedure: SHOULDER ARTHROSCOPY WITH OPEN ROTATOR CUFF REPAIR,SUBACROMINAL DECOMPRESSION,DISTAL CLAVICLE EXCISION,BICEPS TENODESIS, & SUBSCAPULARIS .;  Surgeon: Leim Fabry, MD;  Location: [facility];  Service: Orthopedics;  Laterality: Right;  . TONSILLECTOMY      Family History  Problem Relation Age of Onset  . Heart failure Mother   . Dementia Mother   . Kidney disease Sister   . Heart attack Cousin 60    Social History   Socioeconomic History  . Marital status: Married    Spouse name: Not on file  . Number of children: Not on file  . Years of education: Not on file  . Highest education level: Not on file  Occupational History  . Occupation: self employed  Tobacco Use  . Smoking status: Never Smoker  . Smokeless tobacco: Never Used  Vaping Use  . Vaping Use: Never used  Substance and Sexual Activity  . Alcohol use: Yes    Comment: wine from time to time  . Drug use: No  . Sexual activity: Yes  Other Topics Concern  . Not on file  Social History Narrative   Married with 3 children.  Wife has signs of dementia.   Rare alcohol.  No cigarettes.  Good diet.   Routinely exercises doing bicycle "spinning "does it 3-4 days a week for up to 45 minutes at a time.   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   Social Determinants of Health   Financial Resource Strain:   . Difficulty of Paying  Living Expenses: Not on file  Food Insecurity:   . Worried About Charity fundraiser in the Last Year: Not on file  . Ran Out of Food in the Last Year: Not on file  Transportation Needs:   . Lack of Transportation (Medical): Not on file  . Lack of Transportation (Non-Medical): Not on file  Physical Activity:   . Days of Exercise per Week: Not on file  . Minutes of Exercise per  Session: Not on file  Stress:   . Feeling of Stress : Not on file  Social Connections:   . Frequency of Communication with Friends and Family: Not on file  . Frequency of Social Gatherings with Friends and Family: Not on file  . Attends Religious Services: Not on file  . Active Member of Clubs or Organizations: Not on file  . Attends Archivist Meetings: Not on file  . Marital Status: Not on file  Intimate Partner Violence:   . Fear of Current or Ex-Partner: Not on file  . Emotionally Abused: Not on file  . Physically Abused: Not on file  . Sexually Abused: Not on file    Current Outpatient Medications on File Prior to Visit  Medication Sig Dispense Refill  . Apple Cider Vinegar 600 MG CAPS Take 600 mg by mouth daily.    . Ascorbic Acid (VITAMIN C ADULT GUMMIES PO) Take 2 each by mouth daily.    . benazepril (LOTENSIN) 40 MG tablet Take 40 mg by mouth daily.  1  . Calcium-Phosphorus-Vitamin D (CALCIUM GUMMIES) [id number] MG-MG-UNIT CHEW     . Cinnamon 500 MG TABS Take 1,000 mg by mouth daily.    . hydrochlorothiazide (HYDRODIURIL) 12.5 MG tablet Take 12.5 mg by mouth daily.     . Multiple Vitamins-Minerals (MULTIVITAMIN ADULT) CHEW Chew 1 each by mouth daily.    . Omega-3 Fatty Acids (FISH OIL ADULT GUMMIES PO)     . OVER THE COUNTER MEDICATION Take 1 capsule by mouth daily. Chaga Mushroom Supplement    . pravastatin (PRAVACHOL) 20 MG tablet Take 20 mg by mouth daily.    . vitamin B-12 (CYANOCOBALAMIN) 500 MCG tablet Take by mouth.    . Coenzyme Q10 (COQ10 GUMMIES ADULT PO) Take 2 each by mouth daily.    . Misc Natural Products (HERBAL ENERGY COMPLEX PO) Take 2 each by mouth daily. (Patient not taking: Reported on 10/14/2019)    . Shark Cartilage 740 MG CAPS Take 1,480 mg by mouth 2 (two) times daily. (Patient not taking: Reported on 10/14/2019)    . Turmeric 500 MG CAPS Take 1,000 mg by mouth 2 (two) times daily.     No current facility-administered medications on file  prior to visit.    No Known Allergies     Observations/Objective: Today's Vitals   10/14/19 1336  PainSc: 0-No pain   There is no height or weight on file to calculate BMI.  Physical Exam Constitutional:      General: He is not in acute distress. Neurological:     Mental Status: He is alert.     CBC    Component Value Date/Time   WBC 3.9 (L) 10/13/2019 0818   RBC 4.73 10/13/2019 0818   HGB 13.6 10/13/2019 0818   HCT 38.4 (L) 10/13/2019 0818   PLT 145 (L) 10/13/2019 0818   MCV 81.2 10/13/2019 0818   MCH 28.8 10/13/2019 0818   MCHC 35.4 10/13/2019 0818   RDW 13.5 10/13/2019 0818   LYMPHSABS 1.3  10/13/2019 0818   MONOABS 0.5 10/13/2019 0818   EOSABS 0.1 10/13/2019 0818   BASOSABS 0.0 10/13/2019 0818    CMP     Component Value Date/Time   NA 141 10/13/2019 0818   K 4.4 10/13/2019 0818   CL 104 10/13/2019 0818   CO2 30 10/13/2019 0818   GLUCOSE 92 10/13/2019 0818   BUN 29 (H) 10/13/2019 0818   CREATININE 1.45 (H) 10/13/2019 0818   CALCIUM 9.2 10/13/2019 0818   PROT 7.3 10/13/2019 0818   ALBUMIN 4.0 10/13/2019 0818   AST 21 10/13/2019 0818   ALT 12 10/13/2019 0818   ALKPHOS 47 10/13/2019 0818   BILITOT 0.7 10/13/2019 0818   GFRNONAA 50 (L) 10/13/2019 0818   GFRAA 58 (L) 10/13/2019 0818     Assessment and Plan: 1. Thrombocytopenia (HCC)   2. Leukopenia, unspecified type   3. Stage 3a chronic kidney disease     #Chronic thrombocytopenia, mild, counts wax and wane.  Today's platelet counts are 145,000. # Leukopenia, intermittent, no neutropenia today.  Likely reactive or ethnic.  Hold off additional work-up at this point and continue to observe.  .  # Slightly elevated creatinine, no urinary symptoms.  Patient reports history of use of ibuprofen.  Advised patient to avoid nephrotoxins.  Increase hydration.  Also advised patient to further discuss with primary care provider to see if any additional work-up need to be done.  I will check multiple myeloma panel at  the next visit..    Follow Up Instructions: 6 months   I discussed the assessment and treatment plan with the patient. The patient was provided an opportunity to ask questions and all were answered. The patient agreed with the plan and demonstrated an understanding of the instructions.  The patient was advised to call back or seek an in-person evaluation if the symptoms worsen or if the condition fails to improve as anticipated.    Earlie Server, MD 10/14/2019 5:26 PM

## 2019-11-21 DIAGNOSIS — Z1159 Encounter for screening for other viral diseases: Secondary | ICD-10-CM | POA: Diagnosis not present

## 2019-11-21 DIAGNOSIS — E7800 Pure hypercholesterolemia, unspecified: Secondary | ICD-10-CM | POA: Diagnosis not present

## 2019-11-21 DIAGNOSIS — I10 Essential (primary) hypertension: Secondary | ICD-10-CM | POA: Diagnosis not present

## 2020-02-08 DIAGNOSIS — M25561 Pain in right knee: Secondary | ICD-10-CM | POA: Diagnosis not present

## 2020-02-08 DIAGNOSIS — M1711 Unilateral primary osteoarthritis, right knee: Secondary | ICD-10-CM | POA: Diagnosis not present

## 2020-02-08 DIAGNOSIS — G8929 Other chronic pain: Secondary | ICD-10-CM | POA: Diagnosis not present

## 2020-02-14 DIAGNOSIS — L600 Ingrowing nail: Secondary | ICD-10-CM | POA: Diagnosis not present

## 2020-02-14 DIAGNOSIS — M2041 Other hammer toe(s) (acquired), right foot: Secondary | ICD-10-CM | POA: Diagnosis not present

## 2020-02-14 DIAGNOSIS — B351 Tinea unguium: Secondary | ICD-10-CM | POA: Diagnosis not present

## 2020-02-14 DIAGNOSIS — Q6689 Other  specified congenital deformities of feet: Secondary | ICD-10-CM | POA: Diagnosis not present

## 2020-02-14 DIAGNOSIS — M778 Other enthesopathies, not elsewhere classified: Secondary | ICD-10-CM | POA: Diagnosis not present

## 2020-02-14 DIAGNOSIS — M2042 Other hammer toe(s) (acquired), left foot: Secondary | ICD-10-CM | POA: Diagnosis not present

## 2020-04-16 ENCOUNTER — Inpatient Hospital Stay: Payer: PPO | Attending: Oncology

## 2020-04-16 DIAGNOSIS — N1831 Chronic kidney disease, stage 3a: Secondary | ICD-10-CM | POA: Insufficient documentation

## 2020-04-16 DIAGNOSIS — Z79899 Other long term (current) drug therapy: Secondary | ICD-10-CM | POA: Diagnosis not present

## 2020-04-16 DIAGNOSIS — D696 Thrombocytopenia, unspecified: Secondary | ICD-10-CM | POA: Diagnosis not present

## 2020-04-16 DIAGNOSIS — Z7982 Long term (current) use of aspirin: Secondary | ICD-10-CM | POA: Diagnosis not present

## 2020-04-16 DIAGNOSIS — E785 Hyperlipidemia, unspecified: Secondary | ICD-10-CM | POA: Diagnosis not present

## 2020-04-16 DIAGNOSIS — Z8249 Family history of ischemic heart disease and other diseases of the circulatory system: Secondary | ICD-10-CM | POA: Diagnosis not present

## 2020-04-16 DIAGNOSIS — Z9884 Bariatric surgery status: Secondary | ICD-10-CM | POA: Insufficient documentation

## 2020-04-16 DIAGNOSIS — D72819 Decreased white blood cell count, unspecified: Secondary | ICD-10-CM | POA: Insufficient documentation

## 2020-04-16 DIAGNOSIS — I10 Essential (primary) hypertension: Secondary | ICD-10-CM | POA: Diagnosis not present

## 2020-04-16 LAB — CBC WITH DIFFERENTIAL/PLATELET
Abs Immature Granulocytes: 0 10*3/uL (ref 0.00–0.07)
Basophils Absolute: 0 10*3/uL (ref 0.0–0.1)
Basophils Relative: 1 %
Eosinophils Absolute: 0.1 10*3/uL (ref 0.0–0.5)
Eosinophils Relative: 2 %
HCT: 39 % (ref 39.0–52.0)
Hemoglobin: 13 g/dL (ref 13.0–17.0)
Immature Granulocytes: 0 %
Lymphocytes Relative: 36 %
Lymphs Abs: 1.3 10*3/uL (ref 0.7–4.0)
MCH: 27.7 pg (ref 26.0–34.0)
MCHC: 33.3 g/dL (ref 30.0–36.0)
MCV: 83.2 fL (ref 80.0–100.0)
Monocytes Absolute: 0.4 10*3/uL (ref 0.1–1.0)
Monocytes Relative: 12 %
Neutro Abs: 1.7 10*3/uL (ref 1.7–7.7)
Neutrophils Relative %: 49 %
Platelets: 154 10*3/uL (ref 150–400)
RBC: 4.69 MIL/uL (ref 4.22–5.81)
RDW: 13.9 % (ref 11.5–15.5)
WBC: 3.5 10*3/uL — ABNORMAL LOW (ref 4.0–10.5)
nRBC: 0 % (ref 0.0–0.2)

## 2020-04-16 LAB — COMPREHENSIVE METABOLIC PANEL
ALT: 16 U/L (ref 0–44)
AST: 27 U/L (ref 15–41)
Albumin: 3.7 g/dL (ref 3.5–5.0)
Alkaline Phosphatase: 44 U/L (ref 38–126)
Anion gap: 9 (ref 5–15)
BUN: 24 mg/dL — ABNORMAL HIGH (ref 8–23)
CO2: 28 mmol/L (ref 22–32)
Calcium: 9.1 mg/dL (ref 8.9–10.3)
Chloride: 102 mmol/L (ref 98–111)
Creatinine, Ser: 1.38 mg/dL — ABNORMAL HIGH (ref 0.61–1.24)
GFR, Estimated: 56 mL/min — ABNORMAL LOW (ref >60)
Glucose, Bld: 89 mg/dL (ref 70–99)
Potassium: 4 mmol/L (ref 3.5–5.1)
Sodium: 139 mmol/L (ref 135–145)
Total Bilirubin: 0.8 mg/dL (ref 0.3–1.2)
Total Protein: 7.1 g/dL (ref 6.5–8.1)

## 2020-04-17 LAB — MULTIPLE MYELOMA PANEL, SERUM
Albumin SerPl Elph-Mcnc: 3.8 g/dL (ref 2.9–4.4)
Albumin/Glob SerPl: 1.2 (ref 0.7–1.7)
Alpha 1: 0.2 g/dL (ref 0.0–0.4)
Alpha2 Glob SerPl Elph-Mcnc: 0.6 g/dL (ref 0.4–1.0)
B-Globulin SerPl Elph-Mcnc: 0.9 g/dL (ref 0.7–1.3)
Gamma Glob SerPl Elph-Mcnc: 1.5 g/dL (ref 0.4–1.8)
Globulin, Total: 3.2 g/dL (ref 2.2–3.9)
IgA: 232 mg/dL (ref 61–437)
IgG (Immunoglobin G), Serum: 1489 mg/dL (ref 603–1613)
IgM (Immunoglobulin M), Srm: 45 mg/dL (ref 20–172)
Total Protein ELP: 7 g/dL (ref 6.0–8.5)

## 2020-04-17 LAB — KAPPA/LAMBDA LIGHT CHAINS
Kappa free light chain: 29.3 mg/L — ABNORMAL HIGH (ref 3.3–19.4)
Kappa, lambda light chain ratio: 1.49 (ref 0.26–1.65)
Lambda free light chains: 19.6 mg/L (ref 5.7–26.3)

## 2020-04-18 LAB — COMP PANEL: LEUKEMIA/LYMPHOMA

## 2020-04-23 ENCOUNTER — Inpatient Hospital Stay (HOSPITAL_BASED_OUTPATIENT_CLINIC_OR_DEPARTMENT_OTHER): Payer: PPO | Admitting: Oncology

## 2020-04-23 ENCOUNTER — Encounter: Payer: Self-pay | Admitting: Oncology

## 2020-04-23 VITALS — BP 114/68 | HR 64 | Temp 97.8°F | Resp 18 | Wt 240.4 lb

## 2020-04-23 DIAGNOSIS — D72819 Decreased white blood cell count, unspecified: Secondary | ICD-10-CM | POA: Diagnosis not present

## 2020-04-23 DIAGNOSIS — D696 Thrombocytopenia, unspecified: Secondary | ICD-10-CM

## 2020-04-23 DIAGNOSIS — N1831 Chronic kidney disease, stage 3a: Secondary | ICD-10-CM

## 2020-04-23 NOTE — Progress Notes (Signed)
Pt here for follow up. Pt reports leg and feet swelling every night.

## 2020-04-23 NOTE — Progress Notes (Signed)
Hematology/Oncology Consult note Stephen Schmidt Telephone:([phone] [phone]   Patient Care Team: Dion Body, MD as PCP - General (Family Medicine) Earlie Server, MD as Consulting Physician (Oncology)  REFERRING PROVIDER: Dion Body, MD  CHIEF COMPLAINTS/REASON FOR VISIT:  Evaluation of thrombocytopenia  HISTORY OF PRESENTING ILLNESS:  Stephen Schmidt is a  70 y.o.  male with PMH listed below was seen in consultation at the request of  Dion Body, MD  for evaluation of pancytopenia.  Reviewed patient's labs.  Labs showed decreased platelet counts at 1 35,000, hemoglobin 13.1, WBC 3.2, Reviewed patient's previous labs.  Anemia started 6 months ago.  02/02/2018 labs showed hemoglobin 12.8, normal WBC 4.3, normal platelet count 155. No aggravating or elevated factors.  Associated symptoms or signs:  Denies weight loss, fever, chills, fatigue, night sweats.  Denies hematochezia, hematuria, hematemesis, epistaxis, black tarry stool.  easy bruising.   Context:  History of hepatitis or HIV infection,  History of gastric bypass History of autoimmune disease.   Alcohol use, patient drinks wine or beer to 2 times a week. Admit to drink tonic water with quinine for the past few years for the treatment of bilateral lower extremity cramps. Quinine tonic water helps relieving his lower extremity cramping symptoms-patient has stopped quinine water.  INTERVAL HISTORY Stephen Schmidt is a 70 y.o. male who has above history reviewed by me today presents for follow up visit for management of leukemia. Patient reports feeling well.  Denies any constitutional symptoms.  Denies any acute bleeding or frequent infections.  He presents to discuss lab results  . Review of Systems  Constitutional: Negative for appetite change, chills, fatigue, fever and unexpected weight change.  HENT:   Negative for hearing loss and voice change.   Eyes: Negative for  eye problems and icterus.  Respiratory: Negative for chest tightness, cough and shortness of breath.   Cardiovascular: Negative for chest pain and leg swelling.  Gastrointestinal: Negative for abdominal distention and abdominal pain.  Endocrine: Negative for hot flashes.  Genitourinary: Negative for difficulty urinating, dysuria and frequency.   Musculoskeletal: Negative for arthralgias.  Skin: Negative for itching and rash.  Neurological: Negative for light-headedness and numbness.  Hematological: Negative for adenopathy. Does not bruise/bleed easily.  Psychiatric/Behavioral: Negative for confusion.    MEDICAL HISTORY:  Past Medical History:  Diagnosis Date  . Hyperlipidemia   . Hypertension   . Obesity, Class I, BMI 30.0-34.9 (see actual BMI)    However notably lighter used to be.  Once 324 pounds in 1996    SURGICAL HISTORY: Past Surgical History:  Procedure Laterality Date  . SHOULDER ARTHROSCOPY WITH OPEN ROTATOR CUFF REPAIR Right 02/15/2018   Procedure: SHOULDER ARTHROSCOPY WITH OPEN ROTATOR CUFF REPAIR,SUBACROMINAL DECOMPRESSION,DISTAL CLAVICLE EXCISION,BICEPS TENODESIS, & SUBSCAPULARIS .;  Surgeon: Leim Fabry, MD;  Location: [facility] ORS;  Service: Orthopedics;  Laterality: Right;  . TONSILLECTOMY      SOCIAL HISTORY: Social History   Socioeconomic History  . Marital status: Married    Spouse name: Not on file  . Number of children: Not on file  . Years of education: Not on file  . Highest education level: Not on file  Occupational History  . Occupation: self employed  Tobacco Use  . Smoking status: Never Smoker  . Smokeless tobacco: Never Used  Vaping Use  . Vaping Use: Never used  Substance and Sexual Activity  . Alcohol use: Yes    Comment: wine from time to time  . Drug use:  No  . Sexual activity: Yes  Other Topics Concern  . Not on file  Social History Narrative   Married with 3 children.  Wife has signs of dementia.   Rare alcohol.  No cigarettes.   Good diet.   Routinely exercises doing bicycle "spinning "does it 3-4 days a week for up to 45 minutes at a time.   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   Social Determinants of Health   Financial Resource Strain: Not on file  Food Insecurity: Not on file  Transportation Needs: Not on file  Physical Activity: Not on file  Stress: Not on file  Social Connections: Not on file  Intimate Partner Violence: Not on file    FAMILY HISTORY: Family History  Problem Relation Age of Onset  . Heart failure Mother   . Dementia Mother   . Kidney disease Sister   . Heart attack Cousin 60    ALLERGIES:  has No Known Allergies.  MEDICATIONS:  Current Outpatient Medications  Medication Sig Dispense Refill  . Apple Cider Vinegar 600 MG CAPS Take 600 mg by mouth daily.    . Ascorbic Acid (VITAMIN C ADULT GUMMIES PO) Take 2 each by mouth daily.    Marland Kitchen aspirin 81 MG EC tablet Take 1 tablet by mouth daily.    . benazepril (LOTENSIN) 40 MG tablet Take 40 mg by mouth daily.  1  . Calcium-Phosphorus-Vitamin D (CALCIUM GUMMIES) [phone] MG-MG-UNIT CHEW     . Cinnamon 500 MG TABS Take 1,000 mg by mouth daily.    . Coenzyme Q10 (COQ10 GUMMIES ADULT PO) Take 2 each by mouth daily.    . hydrochlorothiazide (HYDRODIURIL) 12.5 MG tablet Take 12.5 mg by mouth daily.     . Multiple Vitamins-Minerals (MULTIVITAMIN ADULT) CHEW Chew 1 each by mouth daily.    . Omega-3 Fatty Acids (FISH OIL ADULT GUMMIES PO)     . OVER THE COUNTER MEDICATION Take 1 capsule by mouth daily. Chaga Mushroom Supplement    . pravastatin (PRAVACHOL) 20 MG tablet Take 20 mg by mouth daily.    . Turmeric 500 MG CAPS Take 1,000 mg by mouth 2 (two) times daily.    . vitamin B-12 (CYANOCOBALAMIN) 500 MCG tablet Take by mouth.    . Misc Natural Products (HERBAL ENERGY COMPLEX PO) Take 2 each by mouth daily. (Patient not taking: No sig reported)    . Shark Cartilage 740 MG CAPS Take 1,480 mg by mouth 2 (two)  times daily. (Patient not taking: No sig reported)     No current facility-administered medications for this visit.     PHYSICAL EXAMINATION: ECOG PERFORMANCE STATUS: 0 - Asymptomatic Vitals:   04/23/20 1312  BP: 114/68  Pulse: 64  Resp: 18  Temp: 97.8 F (36.6 C)   Filed Weights   04/23/20 1312  Weight: 240 lb 6.4 oz (109 kg)    Physical Exam Constitutional:      General: He is not in acute distress. HENT:     Head: Normocephalic and atraumatic.  Eyes:     General: No scleral icterus.    Pupils: Pupils are equal, round, and reactive to light.  Cardiovascular:     Rate and Rhythm: Normal rate and regular rhythm.     Heart sounds: Normal heart sounds.  Pulmonary:     Effort: Pulmonary effort is normal. No respiratory distress.     Breath sounds: No wheezing.  Abdominal:     General: Bowel sounds  are normal. There is no distension.     Palpations: Abdomen is soft. There is no mass.     Tenderness: There is no abdominal tenderness.  Musculoskeletal:        General: No deformity. Normal range of motion.     Cervical back: Normal range of motion and neck supple.  Skin:    General: Skin is warm and dry.     Findings: No erythema or rash.  Neurological:     Mental Status: He is alert and oriented to person, place, and time.     Cranial Nerves: No cranial nerve deficit.     Coordination: Coordination normal.  Psychiatric:        Behavior: Behavior normal.        Thought Content: Thought content normal.      LABORATORY DATA:  I have reviewed the data as listed Lab Results  Component Value Date   WBC 3.5 (L) 04/16/2020   HGB 13.0 04/16/2020   HCT 39.0 04/16/2020   MCV 83.2 04/16/2020   PLT 154 04/16/2020   Recent Labs    07/13/19 1301 10/13/19 0818 04/16/20 1320  NA 141 141 139  K 4.0 4.4 4.0  CL 104 104 102  CO2 '29 30 28  ' GLUCOSE 92 92 89  BUN 31* 29* 24*  CREATININE 1.36* 1.45* 1.38*  CALCIUM 9.2 9.2 9.1  GFRNONAA 54* 50* 56*  GFRAA >60 58*  --    PROT  --  7.3 7.1  ALBUMIN  --  4.0 3.7  AST  --  21 27  ALT  --  12 16  ALKPHOS  --  47 44  BILITOT  --  0.7 0.8   Iron/TIBC/Ferritin/ %Sat    Component Value Date/Time   IRON 82 08/19/2018 1423   TIBC 232 (L) 08/19/2018 1423   FERRITIN 159 08/19/2018 1423   IRONPCTSAT 35 08/19/2018 1423     RADIOGRAPHIC STUDIES: I have personally reviewed the radiological images as listed and agreed with the findings in the report. 12/24/2017 shoulder MRI 1.  Complete tear of supraspinatus tendon with 3.5 cm of retraction. 2. Moderate tendinosis of the infraspinatus tendon with a small full-thickness tear of the anterior fibers. 3. Severe tendinosis of the subscapularis tendon with a partial-thickness tear. 4. Severe tendinosis of the intra-articular portion of the long head of the biceps tendon.  ASSESSMENT & PLAN:  1. Thrombocytopenia (HCC)   2. Leukopenia, unspecified type   3. Stage 3a chronic kidney disease ([place])    #Leukopenia, Labs reviewed and discussed with patient Leukopenia is very mild, with no decrease of differential groups. Patient has had work-up previously. 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 I will hold off additional work-up at this point.  Continue observation.  #Thrombocytopenia, resolved after he stopped the quinine tonic water. #CKD, recommend patient to avoid nephrotoxins.  Multiple myeloma, light chain ratio negative.  Orders Placed This Encounter  Procedures  . CBC with Differential/Platelet    Standing Status:   Future    Standing Expiration Date:   04/23/2021  . Comprehensive metabolic panel    Standing Status:   Future    Standing Expiration Date:   04/23/2021    All questions were answered. The patient knows to call the clinic with any problems questions or concerns.  Return of visit: 12 months.  Cc Dion Body, MD  Earlie Server, MD, PhD 04/23/2020

## 2020-05-21 DIAGNOSIS — Z1159 Encounter for screening for other viral diseases: Secondary | ICD-10-CM | POA: Diagnosis not present

## 2020-05-21 DIAGNOSIS — E7800 Pure hypercholesterolemia, unspecified: Secondary | ICD-10-CM | POA: Diagnosis not present

## 2020-06-13 DIAGNOSIS — Z0000 Encounter for general adult medical examination without abnormal findings: Secondary | ICD-10-CM | POA: Diagnosis not present

## 2020-06-13 DIAGNOSIS — R600 Localized edema: Secondary | ICD-10-CM | POA: Diagnosis not present

## 2020-06-13 DIAGNOSIS — E7800 Pure hypercholesterolemia, unspecified: Secondary | ICD-10-CM | POA: Diagnosis not present

## 2020-06-13 DIAGNOSIS — I10 Essential (primary) hypertension: Secondary | ICD-10-CM | POA: Diagnosis not present

## 2020-07-09 DIAGNOSIS — Q6689 Other  specified congenital deformities of feet: Secondary | ICD-10-CM | POA: Diagnosis not present

## 2020-07-09 DIAGNOSIS — R600 Localized edema: Secondary | ICD-10-CM | POA: Diagnosis not present

## 2020-07-09 DIAGNOSIS — M2041 Other hammer toe(s) (acquired), right foot: Secondary | ICD-10-CM | POA: Diagnosis not present

## 2020-07-09 DIAGNOSIS — L600 Ingrowing nail: Secondary | ICD-10-CM | POA: Diagnosis not present

## 2020-07-09 DIAGNOSIS — M898X9 Other specified disorders of bone, unspecified site: Secondary | ICD-10-CM | POA: Diagnosis not present

## 2020-07-09 DIAGNOSIS — M2042 Other hammer toe(s) (acquired), left foot: Secondary | ICD-10-CM | POA: Diagnosis not present

## 2020-07-09 DIAGNOSIS — M778 Other enthesopathies, not elsewhere classified: Secondary | ICD-10-CM | POA: Diagnosis not present

## 2020-07-16 DIAGNOSIS — M25561 Pain in right knee: Secondary | ICD-10-CM | POA: Diagnosis not present

## 2020-07-16 DIAGNOSIS — M25461 Effusion, right knee: Secondary | ICD-10-CM | POA: Diagnosis not present

## 2020-07-16 DIAGNOSIS — G8929 Other chronic pain: Secondary | ICD-10-CM | POA: Diagnosis not present

## 2020-07-16 DIAGNOSIS — M1711 Unilateral primary osteoarthritis, right knee: Secondary | ICD-10-CM | POA: Diagnosis not present

## 2020-07-19 DIAGNOSIS — G8929 Other chronic pain: Secondary | ICD-10-CM | POA: Diagnosis not present

## 2020-07-19 DIAGNOSIS — M25561 Pain in right knee: Secondary | ICD-10-CM | POA: Diagnosis not present

## 2020-07-19 DIAGNOSIS — M25461 Effusion, right knee: Secondary | ICD-10-CM | POA: Diagnosis not present

## 2020-07-19 DIAGNOSIS — M1711 Unilateral primary osteoarthritis, right knee: Secondary | ICD-10-CM | POA: Diagnosis not present

## 2020-07-31 DIAGNOSIS — R600 Localized edema: Secondary | ICD-10-CM | POA: Diagnosis not present

## 2020-07-31 DIAGNOSIS — M79672 Pain in left foot: Secondary | ICD-10-CM | POA: Diagnosis not present

## 2020-07-31 DIAGNOSIS — S92512D Displaced fracture of proximal phalanx of left lesser toe(s), subsequent encounter for fracture with routine healing: Secondary | ICD-10-CM | POA: Diagnosis not present

## 2020-08-06 ENCOUNTER — Other Ambulatory Visit: Payer: Self-pay | Admitting: Sports Medicine

## 2020-08-06 ENCOUNTER — Other Ambulatory Visit: Payer: Self-pay

## 2020-08-06 ENCOUNTER — Ambulatory Visit
Admission: RE | Admit: 2020-08-06 | Discharge: 2020-08-06 | Disposition: A | Discharge status: Home / Self Care | Payer: PPO | Source: Ambulatory Visit | Attending: Sports Medicine | Admitting: Sports Medicine

## 2020-08-06 DIAGNOSIS — M7122 Synovial cyst of popliteal space [Baker], left knee: Secondary | ICD-10-CM | POA: Diagnosis not present

## 2020-08-06 DIAGNOSIS — M25562 Pain in left knee: Secondary | ICD-10-CM | POA: Diagnosis not present

## 2020-08-06 DIAGNOSIS — M1712 Unilateral primary osteoarthritis, left knee: Secondary | ICD-10-CM | POA: Diagnosis not present

## 2020-08-06 DIAGNOSIS — M79662 Pain in left lower leg: Secondary | ICD-10-CM | POA: Diagnosis not present

## 2020-08-06 DIAGNOSIS — M7989 Other specified soft tissue disorders: Secondary | ICD-10-CM | POA: Diagnosis not present

## 2020-08-31 ENCOUNTER — Other Ambulatory Visit: Payer: Self-pay | Admitting: Sports Medicine

## 2020-08-31 ENCOUNTER — Other Ambulatory Visit (HOSPITAL_COMMUNITY): Payer: Self-pay | Admitting: Gynecology

## 2020-08-31 ENCOUNTER — Other Ambulatory Visit (HOSPITAL_COMMUNITY): Payer: Self-pay | Admitting: Sports Medicine

## 2020-08-31 DIAGNOSIS — M7989 Other specified soft tissue disorders: Secondary | ICD-10-CM

## 2020-08-31 DIAGNOSIS — M1712 Unilateral primary osteoarthritis, left knee: Secondary | ICD-10-CM

## 2020-08-31 DIAGNOSIS — M25562 Pain in left knee: Secondary | ICD-10-CM

## 2020-08-31 DIAGNOSIS — M79662 Pain in left lower leg: Secondary | ICD-10-CM

## 2020-08-31 DIAGNOSIS — M7122 Synovial cyst of popliteal space [Baker], left knee: Secondary | ICD-10-CM

## 2020-09-03 DIAGNOSIS — M7989 Other specified soft tissue disorders: Secondary | ICD-10-CM | POA: Diagnosis not present

## 2020-09-09 ENCOUNTER — Ambulatory Visit
Admission: RE | Admit: 2020-09-09 | Discharge: 2020-09-09 | Disposition: A | Discharge status: Home / Self Care | Payer: PPO | Source: Ambulatory Visit | Attending: Sports Medicine | Admitting: Sports Medicine

## 2020-09-09 ENCOUNTER — Other Ambulatory Visit: Payer: Self-pay

## 2020-09-09 DIAGNOSIS — M7989 Other specified soft tissue disorders: Secondary | ICD-10-CM | POA: Diagnosis not present

## 2020-09-09 DIAGNOSIS — M25562 Pain in left knee: Secondary | ICD-10-CM

## 2020-09-09 DIAGNOSIS — M7122 Synovial cyst of popliteal space [Baker], left knee: Secondary | ICD-10-CM | POA: Diagnosis not present

## 2020-09-09 DIAGNOSIS — M1712 Unilateral primary osteoarthritis, left knee: Secondary | ICD-10-CM | POA: Insufficient documentation

## 2020-09-09 DIAGNOSIS — M25462 Effusion, left knee: Secondary | ICD-10-CM | POA: Diagnosis not present

## 2020-09-09 DIAGNOSIS — M79662 Pain in left lower leg: Secondary | ICD-10-CM | POA: Insufficient documentation

## 2020-09-17 DIAGNOSIS — R229 Localized swelling, mass and lump, unspecified: Secondary | ICD-10-CM | POA: Diagnosis not present

## 2020-09-17 DIAGNOSIS — M79662 Pain in left lower leg: Secondary | ICD-10-CM | POA: Diagnosis not present

## 2020-09-17 DIAGNOSIS — M1712 Unilateral primary osteoarthritis, left knee: Secondary | ICD-10-CM | POA: Diagnosis not present

## 2020-09-17 DIAGNOSIS — M7989 Other specified soft tissue disorders: Secondary | ICD-10-CM | POA: Diagnosis not present

## 2020-09-17 DIAGNOSIS — M25462 Effusion, left knee: Secondary | ICD-10-CM | POA: Diagnosis not present

## 2020-09-17 DIAGNOSIS — M25562 Pain in left knee: Secondary | ICD-10-CM | POA: Diagnosis not present

## 2020-09-17 DIAGNOSIS — M7122 Synovial cyst of popliteal space [Baker], left knee: Secondary | ICD-10-CM | POA: Diagnosis not present

## 2020-10-15 DIAGNOSIS — B351 Tinea unguium: Secondary | ICD-10-CM | POA: Diagnosis not present

## 2020-10-15 DIAGNOSIS — L600 Ingrowing nail: Secondary | ICD-10-CM | POA: Diagnosis not present

## 2020-10-15 DIAGNOSIS — M778 Other enthesopathies, not elsewhere classified: Secondary | ICD-10-CM | POA: Diagnosis not present

## 2020-10-15 DIAGNOSIS — M2041 Other hammer toe(s) (acquired), right foot: Secondary | ICD-10-CM | POA: Diagnosis not present

## 2020-10-15 DIAGNOSIS — M2042 Other hammer toe(s) (acquired), left foot: Secondary | ICD-10-CM | POA: Diagnosis not present

## 2020-12-06 DIAGNOSIS — E7800 Pure hypercholesterolemia, unspecified: Secondary | ICD-10-CM | POA: Diagnosis not present

## 2021-01-03 DIAGNOSIS — M2012 Hallux valgus (acquired), left foot: Secondary | ICD-10-CM | POA: Diagnosis not present

## 2021-01-03 DIAGNOSIS — M898X9 Other specified disorders of bone, unspecified site: Secondary | ICD-10-CM | POA: Diagnosis not present

## 2021-01-03 DIAGNOSIS — M2011 Hallux valgus (acquired), right foot: Secondary | ICD-10-CM | POA: Diagnosis not present

## 2021-01-03 DIAGNOSIS — L600 Ingrowing nail: Secondary | ICD-10-CM | POA: Diagnosis not present

## 2021-01-03 DIAGNOSIS — M778 Other enthesopathies, not elsewhere classified: Secondary | ICD-10-CM | POA: Diagnosis not present

## 2021-01-15 DIAGNOSIS — E7800 Pure hypercholesterolemia, unspecified: Secondary | ICD-10-CM | POA: Diagnosis not present

## 2021-01-15 DIAGNOSIS — I10 Essential (primary) hypertension: Secondary | ICD-10-CM | POA: Diagnosis not present

## 2021-02-20 DIAGNOSIS — M2042 Other hammer toe(s) (acquired), left foot: Secondary | ICD-10-CM | POA: Diagnosis not present

## 2021-02-20 DIAGNOSIS — M79674 Pain in right toe(s): Secondary | ICD-10-CM | POA: Diagnosis not present

## 2021-02-20 DIAGNOSIS — L600 Ingrowing nail: Secondary | ICD-10-CM | POA: Diagnosis not present

## 2021-02-20 DIAGNOSIS — M898X9 Other specified disorders of bone, unspecified site: Secondary | ICD-10-CM | POA: Diagnosis not present

## 2021-02-20 DIAGNOSIS — M2012 Hallux valgus (acquired), left foot: Secondary | ICD-10-CM | POA: Diagnosis not present

## 2021-02-20 DIAGNOSIS — M2011 Hallux valgus (acquired), right foot: Secondary | ICD-10-CM | POA: Diagnosis not present

## 2021-02-20 DIAGNOSIS — M79675 Pain in left toe(s): Secondary | ICD-10-CM | POA: Diagnosis not present

## 2021-02-20 DIAGNOSIS — M2041 Other hammer toe(s) (acquired), right foot: Secondary | ICD-10-CM | POA: Diagnosis not present

## 2021-04-18 ENCOUNTER — Encounter: Payer: Self-pay | Admitting: Nurse Practitioner

## 2021-04-18 ENCOUNTER — Other Ambulatory Visit: Payer: Self-pay

## 2021-04-18 ENCOUNTER — Inpatient Hospital Stay (HOSPITAL_BASED_OUTPATIENT_CLINIC_OR_DEPARTMENT_OTHER): Payer: PPO | Admitting: Nurse Practitioner

## 2021-04-18 ENCOUNTER — Ambulatory Visit: Payer: PPO | Admitting: Oncology

## 2021-04-18 ENCOUNTER — Inpatient Hospital Stay: Payer: PPO | Attending: Oncology

## 2021-04-18 VITALS — BP 121/64 | HR 86 | Temp 97.6°F | Resp 16 | Ht 77.0 in | Wt 248.0 lb

## 2021-04-18 DIAGNOSIS — Z9884 Bariatric surgery status: Secondary | ICD-10-CM | POA: Insufficient documentation

## 2021-04-18 DIAGNOSIS — Z79899 Other long term (current) drug therapy: Secondary | ICD-10-CM | POA: Insufficient documentation

## 2021-04-18 DIAGNOSIS — N189 Chronic kidney disease, unspecified: Secondary | ICD-10-CM | POA: Insufficient documentation

## 2021-04-18 DIAGNOSIS — Z7982 Long term (current) use of aspirin: Secondary | ICD-10-CM | POA: Insufficient documentation

## 2021-04-18 DIAGNOSIS — D696 Thrombocytopenia, unspecified: Secondary | ICD-10-CM | POA: Insufficient documentation

## 2021-04-18 DIAGNOSIS — D72819 Decreased white blood cell count, unspecified: Secondary | ICD-10-CM | POA: Insufficient documentation

## 2021-04-18 DIAGNOSIS — N1831 Chronic kidney disease, stage 3a: Secondary | ICD-10-CM | POA: Diagnosis not present

## 2021-04-18 LAB — CBC WITH DIFFERENTIAL/PLATELET
Abs Immature Granulocytes: 0.01 10*3/uL (ref 0.00–0.07)
Basophils Absolute: 0 10*3/uL (ref 0.0–0.1)
Basophils Relative: 0 %
Eosinophils Absolute: 0 10*3/uL (ref 0.0–0.5)
Eosinophils Relative: 1 %
HCT: 41.5 % (ref 39.0–52.0)
Hemoglobin: 13.9 g/dL (ref 13.0–17.0)
Immature Granulocytes: 0 %
Lymphocytes Relative: 31 %
Lymphs Abs: 1.2 10*3/uL (ref 0.7–4.0)
MCH: 27.9 pg (ref 26.0–34.0)
MCHC: 33.5 g/dL (ref 30.0–36.0)
MCV: 83.3 fL (ref 80.0–100.0)
Monocytes Absolute: 0.4 10*3/uL (ref 0.1–1.0)
Monocytes Relative: 9 %
Neutro Abs: 2.3 10*3/uL (ref 1.7–7.7)
Neutrophils Relative %: 59 %
Platelets: 146 10*3/uL — ABNORMAL LOW (ref 150–400)
RBC: 4.98 MIL/uL (ref 4.22–5.81)
RDW: 13.7 % (ref 11.5–15.5)
WBC: 3.9 10*3/uL — ABNORMAL LOW (ref 4.0–10.5)
nRBC: 0 % (ref 0.0–0.2)

## 2021-04-18 LAB — COMPREHENSIVE METABOLIC PANEL
ALT: 15 U/L (ref 0–44)
AST: 25 U/L (ref 15–41)
Albumin: 3.8 g/dL (ref 3.5–5.0)
Alkaline Phosphatase: 45 U/L (ref 38–126)
Anion gap: 4 — ABNORMAL LOW (ref 5–15)
BUN: 26 mg/dL — ABNORMAL HIGH (ref 8–23)
CO2: 28 mmol/L (ref 22–32)
Calcium: 8.9 mg/dL (ref 8.9–10.3)
Chloride: 105 mmol/L (ref 98–111)
Creatinine, Ser: 1.27 mg/dL — ABNORMAL HIGH (ref 0.61–1.24)
GFR, Estimated: >60 mL/min (ref >60)
Glucose, Bld: 124 mg/dL — ABNORMAL HIGH (ref 70–99)
Potassium: 3.9 mmol/L (ref 3.5–5.1)
Sodium: 137 mmol/L (ref 135–145)
Total Bilirubin: 0.4 mg/dL (ref 0.3–1.2)
Total Protein: 7.2 g/dL (ref 6.5–8.1)

## 2021-04-18 NOTE — Progress Notes (Signed)
Hematology/Oncology Consult Note [facility] [phone] [phone]   Patient Care Team: Dion Body, MD as PCP - General (Family Medicine) Earlie Server, MD as Consulting Physician (Oncology)  REFERRING PROVIDER: Dion Body, MD  CHIEF COMPLAINTS/REASON FOR VISIT:  Evaluation of thrombocytopenia  HISTORY OF PRESENTING ILLNESS:  Stephen Schmidt is a  71 y.o.  male with PMH listed below was seen in consultation at the request of  Dion Body, MD  for evaluation of pancytopenia.  Reviewed patient's labs.  Labs showed decreased platelet counts at 1 35,000, hemoglobin 13.1, WBC 3.2, Reviewed patient's previous labs.  Anemia started 6 months ago.  02/02/2018 labs showed hemoglobin 12.8, normal WBC 4.3, normal platelet count 155. No aggravating or elevated factors.  Associated symptoms or signs:  Denies weight loss, fever, chills, fatigue, night sweats.  Denies hematochezia, hematuria, hematemesis, epistaxis, black tarry stool.  easy bruising.   Context:  History of hepatitis or HIV infection,  History of gastric bypass History of autoimmune disease.   Alcohol use, patient drinks wine or beer to 2 times a week. Admit to drink tonic water with quinine for the past few years for the treatment of bilateral lower extremity cramps. Quinine tonic water helps relieving his lower extremity cramping symptoms-patient has stopped quinine water.  INTERVAL HISTORY Stephen Schmidt is a 71 y.o. male who has above history reviewed by me today presents for follow up visit for management of leukopenia. Patient reports feeling well.  Denies any constitutional symptoms.  Denies any acute bleeding or frequent infections.  He presents to discuss lab results  . Review of Systems  Constitutional:  Negative for appetite change, chills, fatigue, fever and unexpected weight change.  HENT:   Negative for hearing loss and voice change.   Eyes:  Negative  for eye problems and icterus.  Respiratory:  Negative for chest tightness, cough and shortness of breath.   Cardiovascular:  Negative for chest pain and leg swelling.  Gastrointestinal:  Negative for abdominal distention and abdominal pain.  Endocrine: Negative for hot flashes.  Genitourinary:  Negative for difficulty urinating, dysuria and frequency.   Musculoskeletal:  Negative for arthralgias.  Skin:  Negative for itching and rash.  Neurological:  Negative for light-headedness and numbness.  Hematological:  Negative for adenopathy. Does not bruise/bleed easily.  Psychiatric/Behavioral:  Negative for confusion.    MEDICAL HISTORY:  Past Medical History:  Diagnosis Date   Hyperlipidemia    Hypertension    Obesity, Class I, BMI 30.0-34.9 (see actual BMI)    However notably lighter used to be.  Once 324 pounds in 1996    SURGICAL HISTORY: Past Surgical History:  Procedure Laterality Date   SHOULDER ARTHROSCOPY WITH OPEN ROTATOR CUFF REPAIR Right 02/15/2018   Procedure: SHOULDER ARTHROSCOPY WITH OPEN ROTATOR CUFF REPAIR,SUBACROMINAL DECOMPRESSION,DISTAL CLAVICLE EXCISION,BICEPS TENODESIS, & SUBSCAPULARIS .;  Surgeon: Leim Fabry, MD;  Location: [facility] OR[facility];  Service: Orthopedics;  Laterality: Right;   TONSILLECTOMY      SOCIAL HISTORY: Social History   Socioeconomic History   Marital status: Married    Spouse name: Not on file   Number of children: Not on file   Years of education: Not on file   Highest education level: Not on file  Occupational History   Occupation: self employed  Tobacco Use   Smoking status: Never   Smokeless tobacco: Never  Vaping Use   Vaping Use: Never used  Substance and Sexual Activity   Alcohol use: Yes    Comment:  wine from time to time   Drug use: No   Sexual activity: Yes  Other Topics Concern   Not on file  Social History Narrative   Married with 3 children.  Wife has signs of dementia.   Rare alcohol.  No cigarettes.  Good diet.    Routinely exercises doing bicycle "spinning "does it 3-4 days a week for up to 45 minutes at a time.   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   Social Determinants of Health   Financial Resource Strain: Not on file  Food Insecurity: Not on file  Transportation Needs: Not on file  Physical Activity: Not on file  Stress: Not on file  Social Connections: Not on file  Intimate Partner Violence: Not on file    FAMILY HISTORY: Family History  Problem Relation Age of Onset   Heart failure Mother    Dementia Mother    Kidney disease Sister    Heart attack Cousin 84    ALLERGIES:  has No Known Allergies.  MEDICATIONS:  Current Outpatient Medications  Medication Sig Dispense Refill   Apple Cider Vinegar 600 MG CAPS Take 600 mg by mouth daily.     Ascorbic Acid (VITAMIN C ADULT GUMMIES PO) Take 2 each by mouth daily.     aspirin 81 MG EC tablet Take 1 tablet by mouth daily.     benazepril (LOTENSIN) 40 MG tablet Take 40 mg by mouth daily.  1   Calcium-Phosphorus-Vitamin D (CALCIUM GUMMIES) 250-100-500 MG-MG-UNIT CHEW      Cinnamon 500 MG TABS Take 1,000 mg by mouth daily.     Coenzyme Q10 (COQ10 GUMMIES ADULT PO) Take 2 each by mouth daily.     hydrochlorothiazide (HYDRODIURIL) 12.5 MG tablet Take 12.5 mg by mouth daily.      Multiple Vitamins-Minerals (MULTIVITAMIN ADULT) CHEW Chew 1 each by mouth daily.     Omega-3 Fatty Acids (FISH OIL ADULT GUMMIES PO)      OVER THE COUNTER MEDICATION Take 1 capsule by mouth daily. Chaga Mushroom Supplement     pravastatin (PRAVACHOL) 20 MG tablet Take 20 mg by mouth daily.     Turmeric 500 MG CAPS Take 1,000 mg by mouth 2 (two) times daily.     vitamin B-12 (CYANOCOBALAMIN) 500 MCG tablet Take by mouth.     Misc Natural Products (HERBAL ENERGY COMPLEX PO) Take 2 each by mouth daily. (Patient not taking: Reported on 10/14/2019)     Shark Cartilage 740 MG CAPS Take 1,480 mg by mouth 2 (two) times daily. (Patient not  taking: Reported on 10/14/2019)     No current facility-administered medications for this visit.     PHYSICAL EXAMINATION: ECOG PERFORMANCE STATUS: 0 - Asymptomatic Vitals:   04/18/21 1055  BP: 121/64  Pulse: 86  Resp: 16  Temp: 97.6 F (36.4 C)  SpO2: 100%   Filed Weights   04/18/21 1055  Weight: 248 lb (112.5 kg)    Physical Exam Constitutional:      General: He is not in acute distress. HENT:     Head: Normocephalic and atraumatic.  Eyes:     General: No scleral icterus.    Pupils: Pupils are equal, round, and reactive to light.  Cardiovascular:     Rate and Rhythm: Normal rate and regular rhythm.     Heart sounds: Normal heart sounds.  Pulmonary:     Effort: Pulmonary effort is normal. No respiratory distress.     Breath sounds: No  wheezing.  Abdominal:     General: Bowel sounds are normal. There is no distension.     Palpations: Abdomen is soft. There is no mass.     Tenderness: There is no abdominal tenderness.  Musculoskeletal:        General: No deformity. Normal range of motion.     Cervical back: Normal range of motion and neck supple.  Skin:    General: Skin is warm and dry.     Findings: No erythema or rash.  Neurological:     Mental Status: He is alert and oriented to person, place, and time.     Cranial Nerves: No cranial nerve deficit.     Coordination: Coordination normal.  Psychiatric:        Behavior: Behavior normal.        Thought Content: Thought content normal.     LABORATORY DATA:  I have reviewed the data as listed Lab Results  Component Value Date   WBC 3.9 (L) 04/18/2021   HGB 13.9 04/18/2021   HCT 41.5 04/18/2021   MCV 83.3 04/18/2021   PLT 146 (L) 04/18/2021   Recent Labs    04/18/21 1045  NA 137  K 3.9  CL 105  CO2 28  GLUCOSE 124*  BUN 26*  CREATININE 1.27*  CALCIUM 8.9  GFRNONAA >60  PROT 7.2  ALBUMIN 3.8  AST 25  ALT 15  ALKPHOS 45  BILITOT 0.4    Iron/TIBC/Ferritin/ %Sat    Component Value  Date/Time   IRON 82 08/19/2018 1423   TIBC 232 (L) 08/19/2018 1423   FERRITIN 159 08/19/2018 1423   IRONPCTSAT 35 08/19/2018 1423     RADIOGRAPHIC STUDIES: I have personally reviewed the radiological images as listed and agreed with the findings in the report. 12/24/2017 shoulder MRI 1.  Complete tear of supraspinatus tendon with 3.5 cm of retraction. 2. Moderate tendinosis of the infraspinatus tendon with a small full-thickness tear of the anterior fibers. 3. Severe tendinosis of the subscapularis tendon with a partial-thickness tear. 4. Severe tendinosis of the intra-articular portion of the long head of the biceps tendon.  ASSESSMENT & PLAN:  No diagnosis found.  #Leukopenia- Labs reviewed and discussed with patient. Leukopenia is very mild, with no decrease of differential groups. Previous workup was either normal or unremarkable. Suspect reactive/ethnic leukopenia. Given stability of his counts recommend continued observation.   #Thrombocytopenia- improved after he stopped qunine tonic water. Mild thrombocytopenia today. Asymptomatic. Monitor.   #CKD- recommend patient to avoid nephrotoxins.  Multiple myeloma, light chain ratio negative.  No orders of the defined types were placed in this encounter.   All questions were answered. The patient knows to call the clinic with any problems questions or concerns.  Return of visit:  1 year- labs & see Dr Tasia Catchings for results. Can be virtual visit if patient prefers.   Cc Dion Body, MD  Beckey Rutter, DNP, AGNP-C [place] at [facility] [phone] (clinic) 04/18/2021

## 2021-04-25 DIAGNOSIS — B351 Tinea unguium: Secondary | ICD-10-CM | POA: Diagnosis not present

## 2021-04-25 DIAGNOSIS — M79674 Pain in right toe(s): Secondary | ICD-10-CM | POA: Diagnosis not present

## 2021-04-25 DIAGNOSIS — M79675 Pain in left toe(s): Secondary | ICD-10-CM | POA: Diagnosis not present

## 2021-06-21 DIAGNOSIS — M2041 Other hammer toe(s) (acquired), right foot: Secondary | ICD-10-CM | POA: Diagnosis not present

## 2021-06-21 DIAGNOSIS — M778 Other enthesopathies, not elsewhere classified: Secondary | ICD-10-CM | POA: Diagnosis not present

## 2021-06-21 DIAGNOSIS — M2042 Other hammer toe(s) (acquired), left foot: Secondary | ICD-10-CM | POA: Diagnosis not present

## 2021-06-21 DIAGNOSIS — L600 Ingrowing nail: Secondary | ICD-10-CM | POA: Diagnosis not present

## 2021-07-11 DIAGNOSIS — I10 Essential (primary) hypertension: Secondary | ICD-10-CM | POA: Diagnosis not present

## 2021-07-11 DIAGNOSIS — E7800 Pure hypercholesterolemia, unspecified: Secondary | ICD-10-CM | POA: Diagnosis not present

## 2021-07-16 DIAGNOSIS — I10 Essential (primary) hypertension: Secondary | ICD-10-CM | POA: Diagnosis not present

## 2021-07-16 DIAGNOSIS — Z0000 Encounter for general adult medical examination without abnormal findings: Secondary | ICD-10-CM | POA: Diagnosis not present

## 2021-07-16 DIAGNOSIS — D72819 Decreased white blood cell count, unspecified: Secondary | ICD-10-CM | POA: Diagnosis not present

## 2021-07-16 DIAGNOSIS — E7800 Pure hypercholesterolemia, unspecified: Secondary | ICD-10-CM | POA: Diagnosis not present

## 2021-07-16 DIAGNOSIS — M25551 Pain in right hip: Secondary | ICD-10-CM | POA: Diagnosis not present

## 2021-07-16 DIAGNOSIS — M25552 Pain in left hip: Secondary | ICD-10-CM | POA: Diagnosis not present

## 2021-07-29 DIAGNOSIS — M25552 Pain in left hip: Secondary | ICD-10-CM | POA: Diagnosis not present

## 2021-07-29 DIAGNOSIS — M25551 Pain in right hip: Secondary | ICD-10-CM | POA: Diagnosis not present

## 2021-07-29 DIAGNOSIS — M160 Bilateral primary osteoarthritis of hip: Secondary | ICD-10-CM | POA: Diagnosis not present

## 2021-08-12 DIAGNOSIS — M170 Bilateral primary osteoarthritis of knee: Secondary | ICD-10-CM | POA: Diagnosis not present

## 2021-08-12 DIAGNOSIS — M25552 Pain in left hip: Secondary | ICD-10-CM | POA: Diagnosis not present

## 2021-08-12 DIAGNOSIS — M25551 Pain in right hip: Secondary | ICD-10-CM | POA: Diagnosis not present

## 2021-08-22 DIAGNOSIS — M160 Bilateral primary osteoarthritis of hip: Secondary | ICD-10-CM | POA: Diagnosis not present

## 2021-09-05 ENCOUNTER — Other Ambulatory Visit: Payer: Self-pay | Admitting: Sports Medicine

## 2021-09-05 DIAGNOSIS — M25561 Pain in right knee: Secondary | ICD-10-CM | POA: Diagnosis not present

## 2021-09-05 DIAGNOSIS — G8929 Other chronic pain: Secondary | ICD-10-CM | POA: Diagnosis not present

## 2021-09-05 DIAGNOSIS — M25551 Pain in right hip: Secondary | ICD-10-CM | POA: Diagnosis not present

## 2021-09-05 DIAGNOSIS — M25552 Pain in left hip: Secondary | ICD-10-CM | POA: Diagnosis not present

## 2021-09-05 DIAGNOSIS — M5137 Other intervertebral disc degeneration, lumbosacral region: Secondary | ICD-10-CM | POA: Diagnosis not present

## 2021-09-05 DIAGNOSIS — M1611 Unilateral primary osteoarthritis, right hip: Secondary | ICD-10-CM

## 2021-09-05 DIAGNOSIS — M47816 Spondylosis without myelopathy or radiculopathy, lumbar region: Secondary | ICD-10-CM | POA: Diagnosis not present

## 2021-09-05 DIAGNOSIS — M1612 Unilateral primary osteoarthritis, left hip: Secondary | ICD-10-CM | POA: Diagnosis not present

## 2021-09-05 DIAGNOSIS — M1711 Unilateral primary osteoarthritis, right knee: Secondary | ICD-10-CM | POA: Diagnosis not present

## 2021-09-09 ENCOUNTER — Ambulatory Visit
Admission: RE | Admit: 2021-09-09 | Discharge: 2021-09-09 | Disposition: A | Discharge status: Home / Self Care | Payer: PPO | Source: Ambulatory Visit | Attending: Sports Medicine | Admitting: Sports Medicine

## 2021-09-09 DIAGNOSIS — M25551 Pain in right hip: Secondary | ICD-10-CM | POA: Insufficient documentation

## 2021-09-09 DIAGNOSIS — M1611 Unilateral primary osteoarthritis, right hip: Secondary | ICD-10-CM | POA: Insufficient documentation

## 2021-09-09 DIAGNOSIS — M25552 Pain in left hip: Secondary | ICD-10-CM | POA: Diagnosis not present

## 2021-09-24 DIAGNOSIS — M79675 Pain in left toe(s): Secondary | ICD-10-CM | POA: Diagnosis not present

## 2021-09-24 DIAGNOSIS — M778 Other enthesopathies, not elsewhere classified: Secondary | ICD-10-CM | POA: Diagnosis not present

## 2021-09-24 DIAGNOSIS — L600 Ingrowing nail: Secondary | ICD-10-CM | POA: Diagnosis not present

## 2021-09-24 DIAGNOSIS — B351 Tinea unguium: Secondary | ICD-10-CM | POA: Diagnosis not present

## 2021-09-24 DIAGNOSIS — M2041 Other hammer toe(s) (acquired), right foot: Secondary | ICD-10-CM | POA: Diagnosis not present

## 2021-09-24 DIAGNOSIS — M79674 Pain in right toe(s): Secondary | ICD-10-CM | POA: Diagnosis not present

## 2021-09-24 DIAGNOSIS — M2042 Other hammer toe(s) (acquired), left foot: Secondary | ICD-10-CM | POA: Diagnosis not present

## 2021-11-26 DIAGNOSIS — M79674 Pain in right toe(s): Secondary | ICD-10-CM | POA: Diagnosis not present

## 2021-11-26 DIAGNOSIS — M79675 Pain in left toe(s): Secondary | ICD-10-CM | POA: Diagnosis not present

## 2021-11-26 DIAGNOSIS — B351 Tinea unguium: Secondary | ICD-10-CM | POA: Diagnosis not present

## 2022-01-14 DIAGNOSIS — E7800 Pure hypercholesterolemia, unspecified: Secondary | ICD-10-CM | POA: Diagnosis not present

## 2022-01-24 DIAGNOSIS — N289 Disorder of kidney and ureter, unspecified: Secondary | ICD-10-CM | POA: Diagnosis not present

## 2022-01-24 DIAGNOSIS — R600 Localized edema: Secondary | ICD-10-CM | POA: Diagnosis not present

## 2022-01-24 DIAGNOSIS — E7800 Pure hypercholesterolemia, unspecified: Secondary | ICD-10-CM | POA: Diagnosis not present

## 2022-01-24 DIAGNOSIS — I10 Essential (primary) hypertension: Secondary | ICD-10-CM | POA: Diagnosis not present

## 2022-01-31 DIAGNOSIS — I10 Essential (primary) hypertension: Secondary | ICD-10-CM | POA: Diagnosis not present

## 2022-01-31 DIAGNOSIS — N289 Disorder of kidney and ureter, unspecified: Secondary | ICD-10-CM | POA: Diagnosis not present

## 2022-01-31 DIAGNOSIS — E7800 Pure hypercholesterolemia, unspecified: Secondary | ICD-10-CM | POA: Diagnosis not present

## 2022-02-06 DIAGNOSIS — M778 Other enthesopathies, not elsewhere classified: Secondary | ICD-10-CM | POA: Diagnosis not present

## 2022-02-06 DIAGNOSIS — M898X9 Other specified disorders of bone, unspecified site: Secondary | ICD-10-CM | POA: Diagnosis not present

## 2022-02-06 DIAGNOSIS — M79674 Pain in right toe(s): Secondary | ICD-10-CM | POA: Diagnosis not present

## 2022-02-06 DIAGNOSIS — M2011 Hallux valgus (acquired), right foot: Secondary | ICD-10-CM | POA: Diagnosis not present

## 2022-02-06 DIAGNOSIS — L600 Ingrowing nail: Secondary | ICD-10-CM | POA: Diagnosis not present

## 2022-02-06 DIAGNOSIS — M79675 Pain in left toe(s): Secondary | ICD-10-CM | POA: Diagnosis not present

## 2022-02-06 DIAGNOSIS — B351 Tinea unguium: Secondary | ICD-10-CM | POA: Diagnosis not present

## 2022-02-06 DIAGNOSIS — M2012 Hallux valgus (acquired), left foot: Secondary | ICD-10-CM | POA: Diagnosis not present

## 2022-02-06 DIAGNOSIS — M2041 Other hammer toe(s) (acquired), right foot: Secondary | ICD-10-CM | POA: Diagnosis not present

## 2022-02-06 DIAGNOSIS — M2042 Other hammer toe(s) (acquired), left foot: Secondary | ICD-10-CM | POA: Diagnosis not present

## 2022-03-18 DIAGNOSIS — I10 Essential (primary) hypertension: Secondary | ICD-10-CM | POA: Diagnosis not present

## 2022-03-18 DIAGNOSIS — E7800 Pure hypercholesterolemia, unspecified: Secondary | ICD-10-CM | POA: Diagnosis not present

## 2022-03-18 DIAGNOSIS — N289 Disorder of kidney and ureter, unspecified: Secondary | ICD-10-CM | POA: Diagnosis not present

## 2022-03-20 DIAGNOSIS — M25551 Pain in right hip: Secondary | ICD-10-CM | POA: Diagnosis not present

## 2022-03-20 DIAGNOSIS — M25552 Pain in left hip: Secondary | ICD-10-CM | POA: Diagnosis not present

## 2022-03-27 DIAGNOSIS — E7800 Pure hypercholesterolemia, unspecified: Secondary | ICD-10-CM | POA: Diagnosis not present

## 2022-03-27 DIAGNOSIS — I10 Essential (primary) hypertension: Secondary | ICD-10-CM | POA: Diagnosis not present

## 2022-03-27 DIAGNOSIS — N289 Disorder of kidney and ureter, unspecified: Secondary | ICD-10-CM | POA: Diagnosis not present

## 2022-04-18 ENCOUNTER — Encounter: Payer: Self-pay | Admitting: Oncology

## 2022-04-18 ENCOUNTER — Other Ambulatory Visit: Payer: Self-pay

## 2022-04-18 ENCOUNTER — Inpatient Hospital Stay: Payer: PPO | Admitting: Oncology

## 2022-04-18 ENCOUNTER — Inpatient Hospital Stay: Payer: PPO | Attending: Oncology

## 2022-04-18 VITALS — BP 137/75 | HR 65 | Temp 98.0°F | Resp 16 | Wt 243.2 lb

## 2022-04-18 DIAGNOSIS — D696 Thrombocytopenia, unspecified: Secondary | ICD-10-CM

## 2022-04-18 DIAGNOSIS — Z7982 Long term (current) use of aspirin: Secondary | ICD-10-CM | POA: Insufficient documentation

## 2022-04-18 DIAGNOSIS — N1831 Chronic kidney disease, stage 3a: Secondary | ICD-10-CM | POA: Insufficient documentation

## 2022-04-18 DIAGNOSIS — D72819 Decreased white blood cell count, unspecified: Secondary | ICD-10-CM

## 2022-04-18 DIAGNOSIS — Z9884 Bariatric surgery status: Secondary | ICD-10-CM | POA: Diagnosis not present

## 2022-04-18 DIAGNOSIS — E7800 Pure hypercholesterolemia, unspecified: Secondary | ICD-10-CM | POA: Diagnosis not present

## 2022-04-18 DIAGNOSIS — Z79899 Other long term (current) drug therapy: Secondary | ICD-10-CM | POA: Diagnosis not present

## 2022-04-18 DIAGNOSIS — I129 Hypertensive chronic kidney disease with stage 1 through stage 4 chronic kidney disease, or unspecified chronic kidney disease: Secondary | ICD-10-CM | POA: Insufficient documentation

## 2022-04-18 LAB — CBC WITH DIFFERENTIAL/PLATELET
Abs Immature Granulocytes: 0 10*3/uL (ref 0.00–0.07)
Basophils Absolute: 0 10*3/uL (ref 0.0–0.1)
Basophils Relative: 0 %
Eosinophils Absolute: 0.1 10*3/uL (ref 0.0–0.5)
Eosinophils Relative: 3 %
HCT: 41.2 % (ref 39.0–52.0)
Hemoglobin: 13.6 g/dL (ref 13.0–17.0)
Immature Granulocytes: 0 %
Lymphocytes Relative: 35 %
Lymphs Abs: 1.3 10*3/uL (ref 0.7–4.0)
MCH: 27.9 pg (ref 26.0–34.0)
MCHC: 33 g/dL (ref 30.0–36.0)
MCV: 84.6 fL (ref 80.0–100.0)
Monocytes Absolute: 0.4 10*3/uL (ref 0.1–1.0)
Monocytes Relative: 11 %
Neutro Abs: 1.9 10*3/uL (ref 1.7–7.7)
Neutrophils Relative %: 51 %
Platelets: 138 10*3/uL — ABNORMAL LOW (ref 150–400)
RBC: 4.87 MIL/uL (ref 4.22–5.81)
RDW: 14.1 % (ref 11.5–15.5)
WBC: 3.7 10*3/uL — ABNORMAL LOW (ref 4.0–10.5)
nRBC: 0 % (ref 0.0–0.2)

## 2022-04-18 LAB — IMMATURE PLATELET FRACTION: Immature Platelet Fraction: 3.2 % (ref 1.2–8.6)

## 2022-04-18 NOTE — Assessment & Plan Note (Addendum)
Encourage oral hydration and avoid nephrotoxins.   Multiple myeloma panel negative M protein.  Normal light chain ratio.

## 2022-04-18 NOTE — Assessment & Plan Note (Addendum)
Chronic thrombocytopenia, count is slightly decreased compared to his baseline. Denies routine alcohol use, quinine water use.  Immature platelet count normal, physical examination is negative for significant splenomegaly. Recommend observation.  May need bone marrow biopsy if platelet count progressively decreases

## 2022-04-18 NOTE — Progress Notes (Signed)
Hematology/Oncology Progress note [phone] [phone]) [phone]      CHIEF COMPLAINTS/REASON FOR VISIT:   Thrombocytopenia  ASSESSMENT & PLAN:   Thrombocytopenia (HCC) Chronic thrombocytopenia, count is slightly decreased compared to his baseline. Denies routine alcohol use, quinine water use.  Immature platelet count normal, physical examination is negative for significant splenomegaly. Recommend observation.  May need bone marrow biopsy if platelet count progressively decreases  Leukopenia Labs reviewed and discussed with patient Leukopenia is very mild, with no decrease of differential groups. Patient has had negative work-up previously. 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 Continue observation.  Stage 3a chronic kidney disease ([place]) Encourage oral hydration and avoid nephrotoxins.   Multiple myeloma panel negative M protein.  Normal light chain ratio.   Orders Placed This Encounter  Procedures   CBC with Differential ([place] Only)    Standing Status:   Future    Standing Expiration Date:   04/19/2023   CMP ([place] only)    Standing Status:   Future    Standing Expiration Date:   04/19/2023   Vitamin B12    Standing Status:   Future    Standing Expiration Date:   04/19/2023   Folate    Standing Status:   Future    Standing Expiration Date:   04/19/2023   Follow-up in a year. All questions were answered. The patient knows to call the clinic with any problems, questions or concerns.  Earlie Server, MD, PhD [facility] Hematology Oncology 04/18/2022    HISTORY OF PRESENTING ILLNESS:  Stephen Schmidt is a  72 y.o.  male with PMH listed below was seen in consultation at the request of  Dion Body, MD  for evaluation of pancytopenia.  Reviewed patient's labs.  Labs showed decreased platelet counts at 1 35,000, hemoglobin 13.1, WBC 3.2, Reviewed patient's previous labs.  Anemia started 6 months ago.  02/02/2018 labs showed  hemoglobin 12.8, normal WBC 4.3, normal platelet count 155. No aggravating or elevated factors.  Associated symptoms or signs:  Denies weight loss, fever, chills, fatigue, night sweats.  Denies hematochezia, hematuria, hematemesis, epistaxis, black tarry stool.  easy bruising.   Context:  History of hepatitis or HIV infection,  History of gastric bypass History of autoimmune disease.   Alcohol use, patient drinks wine or beer to 2 times a week. Admit to drink tonic water with quinine for the past few years for the treatment of bilateral lower extremity cramps. Quinine tonic water helps relieving his lower extremity cramping symptoms-patient has stopped quinine water.  INTERVAL HISTORY Stephen Schmidt is a 72 y.o. male who has above history reviewed by me today presents for follow up visit for management of leukemia. Patient reports feeling well.   Denies any constitutional symptoms.  Denies any easy bruising or bleeding.  . Review of Systems  Constitutional:  Negative for appetite change, chills, fatigue, fever and unexpected weight change.  HENT:   Negative for hearing loss and voice change.   Eyes:  Negative for eye problems and icterus.  Respiratory:  Negative for chest tightness, cough and shortness of breath.   Cardiovascular:  Negative for chest pain and leg swelling.  Gastrointestinal:  Negative for abdominal distention and abdominal pain.  Endocrine: Negative for hot flashes.  Genitourinary:  Negative for difficulty urinating, dysuria and frequency.   Musculoskeletal:  Negative for arthralgias.  Skin:  Negative for itching and rash.  Neurological:  Negative for light-headedness and numbness.  Hematological:  Negative for adenopathy. Does not  bruise/bleed easily.  Psychiatric/Behavioral:  Negative for confusion.     MEDICAL HISTORY:  Past Medical History:  Diagnosis Date   Hyperlipidemia    Hypertension    Obesity, Class I, BMI 30.0-34.9 (see actual BMI)    However  notably lighter used to be.  Once 324 pounds in 1996    SURGICAL HISTORY: Past Surgical History:  Procedure Laterality Date   SHOULDER ARTHROSCOPY WITH OPEN ROTATOR CUFF REPAIR Right 02/15/2018   Procedure: SHOULDER ARTHROSCOPY WITH OPEN ROTATOR CUFF REPAIR,SUBACROMINAL DECOMPRESSION,DISTAL CLAVICLE EXCISION,BICEPS TENODESIS, & SUBSCAPULARIS .;  Surgeon: Leim Fabry, MD;  Location: [facility] ORS;  Service: Orthopedics;  Laterality: Right;   TONSILLECTOMY      SOCIAL HISTORY: Social History   Socioeconomic History   Marital status: Married    Spouse name: Not on file   Number of children: Not on file   Years of education: Not on file   Highest education level: Not on file  Occupational History   Occupation: self employed  Tobacco Use   Smoking status: Never   Smokeless tobacco: Never  Vaping Use   Vaping Use: Never used  Substance and Sexual Activity   Alcohol use: Yes    Comment: wine from time to time   Drug use: No   Sexual activity: Yes  Other Topics Concern   Not on file  Social History Narrative   Married with 3 children.  Wife has signs of dementia.   Rare alcohol.  No cigarettes.  Good diet.   Routinely exercises doing bicycle "spinning "does it 3-4 days a week for up to 45 minutes at a time.   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   Social Determinants of Health   Financial Resource Strain: Not on file  Food Insecurity: Not on file  Transportation Needs: Not on file  Physical Activity: Not on file  Stress: Not on file  Social Connections: Not on file  Intimate Partner Violence: Not on file    FAMILY HISTORY: Family History  Problem Relation Age of Onset   Heart failure Mother    Dementia Mother    Kidney disease Sister    Heart attack Cousin 32    ALLERGIES:  has No Known Allergies.  MEDICATIONS:  Current Outpatient Medications  Medication Sig Dispense Refill   Apple Cider Vinegar 600 MG CAPS Take 600 mg by mouth daily.      Ascorbic Acid (VITAMIN C ADULT GUMMIES PO) Take 2 each by mouth daily.     aspirin 81 MG EC tablet Take 1 tablet by mouth daily.     benazepril (LOTENSIN) 40 MG tablet Take 40 mg by mouth daily.  1   Calcium-Phosphorus-Vitamin D (CALCIUM GUMMIES) 250-100-500 MG-MG-UNIT CHEW      Cinnamon 500 MG TABS Take 1,000 mg by mouth daily.     Coenzyme Q10 (COQ10 GUMMIES ADULT PO) Take 2 each by mouth daily.     hydrochlorothiazide (HYDRODIURIL) 12.5 MG tablet Take 12.5 mg by mouth daily.      Multiple Vitamins-Minerals (MULTIVITAMIN ADULT) CHEW Chew 1 each by mouth daily.     Omega-3 Fatty Acids (FISH OIL ADULT GUMMIES PO)      OVER THE COUNTER MEDICATION Take 1 capsule by mouth daily. Chaga Mushroom Supplement     pravastatin (PRAVACHOL) 20 MG tablet Take 20 mg by mouth daily.     Turmeric 500 MG CAPS Take 1,000 mg by mouth 2 (two) times daily.     vitamin B-12 (CYANOCOBALAMIN)  500 MCG tablet Take by mouth.     Misc Natural Products (HERBAL ENERGY COMPLEX PO) Take 2 each by mouth daily. (Patient not taking: Reported on 04/18/2022)     Shark Cartilage 740 MG CAPS Take 1,480 mg by mouth 2 (two) times daily. (Patient not taking: Reported on 10/14/2019)     No current facility-administered medications for this visit.     PHYSICAL EXAMINATION: ECOG PERFORMANCE STATUS: 0 - Asymptomatic Vitals:   04/18/22 1041  BP: 137/75  Pulse: 65  Resp: 16  Temp: 98 F (36.7 C)  SpO2: 95%   Filed Weights   04/18/22 1041  Weight: 243 lb 3.2 oz (110.3 kg)    Physical Exam Constitutional:      General: He is not in acute distress. HENT:     Head: Normocephalic and atraumatic.  Eyes:     General: No scleral icterus.    Pupils: Pupils are equal, round, and reactive to light.  Cardiovascular:     Rate and Rhythm: Normal rate and regular rhythm.  Pulmonary:     Effort: Pulmonary effort is normal. No respiratory distress.     Breath sounds: No wheezing.  Abdominal:     General: Bowel sounds are  normal. There is no distension.     Palpations: Abdomen is soft. There is no mass.     Tenderness: There is no abdominal tenderness.  Musculoskeletal:        General: No deformity. Normal range of motion.     Cervical back: Normal range of motion and neck supple.  Skin:    General: Skin is warm and dry.     Findings: No erythema or rash.  Neurological:     Mental Status: He is alert and oriented to person, place, and time. Mental status is at baseline.     Cranial Nerves: No cranial nerve deficit.     Coordination: Coordination normal.  Psychiatric:        Mood and Affect: Mood normal.      LABORATORY DATA:  I have reviewed the data as listed    Latest Ref Rng & Units 04/18/2022   10:30 AM 04/18/2021   10:45 AM 04/16/2020    1:20 PM  CBC  WBC 4.0 - 10.5 K/uL 3.7  3.9  3.5   Hemoglobin 13.0 - 17.0 g/dL 13.6  13.9  13.0   Hematocrit 39.0 - 52.0 % 41.2  41.5  39.0   Platelets 150 - 400 K/uL 138  146  154        RADIOGRAPHIC STUDIES: I have personally reviewed the radiological images as listed and agreed with the findings in the report. No results found.

## 2022-04-18 NOTE — Assessment & Plan Note (Addendum)
Labs reviewed and discussed with patient Leukopenia is very mild, with no decrease of differential groups. Patient has had negative work-up previously. 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 Continue observation.

## 2022-04-23 DIAGNOSIS — B351 Tinea unguium: Secondary | ICD-10-CM | POA: Diagnosis not present

## 2022-04-23 DIAGNOSIS — M79675 Pain in left toe(s): Secondary | ICD-10-CM | POA: Diagnosis not present

## 2022-04-23 DIAGNOSIS — M79674 Pain in right toe(s): Secondary | ICD-10-CM | POA: Diagnosis not present

## 2022-04-25 DIAGNOSIS — I10 Essential (primary) hypertension: Secondary | ICD-10-CM | POA: Diagnosis not present

## 2022-04-25 DIAGNOSIS — D72819 Decreased white blood cell count, unspecified: Secondary | ICD-10-CM | POA: Diagnosis not present

## 2022-04-25 DIAGNOSIS — D696 Thrombocytopenia, unspecified: Secondary | ICD-10-CM | POA: Diagnosis not present

## 2022-04-25 DIAGNOSIS — E7800 Pure hypercholesterolemia, unspecified: Secondary | ICD-10-CM | POA: Diagnosis not present

## 2022-04-28 DIAGNOSIS — I10 Essential (primary) hypertension: Secondary | ICD-10-CM | POA: Diagnosis not present

## 2022-04-28 DIAGNOSIS — M25551 Pain in right hip: Secondary | ICD-10-CM | POA: Diagnosis not present

## 2022-04-28 DIAGNOSIS — D696 Thrombocytopenia, unspecified: Secondary | ICD-10-CM | POA: Diagnosis not present

## 2022-04-28 DIAGNOSIS — D72819 Decreased white blood cell count, unspecified: Secondary | ICD-10-CM | POA: Diagnosis not present

## 2022-04-28 DIAGNOSIS — E7800 Pure hypercholesterolemia, unspecified: Secondary | ICD-10-CM | POA: Diagnosis not present

## 2022-04-28 DIAGNOSIS — M25552 Pain in left hip: Secondary | ICD-10-CM | POA: Diagnosis not present

## 2022-04-28 DIAGNOSIS — N289 Disorder of kidney and ureter, unspecified: Secondary | ICD-10-CM | POA: Diagnosis not present

## 2022-05-29 DIAGNOSIS — D696 Thrombocytopenia, unspecified: Secondary | ICD-10-CM | POA: Diagnosis not present

## 2022-05-29 DIAGNOSIS — D72819 Decreased white blood cell count, unspecified: Secondary | ICD-10-CM | POA: Diagnosis not present

## 2022-05-29 DIAGNOSIS — I10 Essential (primary) hypertension: Secondary | ICD-10-CM | POA: Diagnosis not present

## 2022-05-29 DIAGNOSIS — E7800 Pure hypercholesterolemia, unspecified: Secondary | ICD-10-CM | POA: Diagnosis not present

## 2022-06-10 DIAGNOSIS — M2012 Hallux valgus (acquired), left foot: Secondary | ICD-10-CM | POA: Diagnosis not present

## 2022-06-10 DIAGNOSIS — M2042 Other hammer toe(s) (acquired), left foot: Secondary | ICD-10-CM | POA: Diagnosis not present

## 2022-06-10 DIAGNOSIS — M778 Other enthesopathies, not elsewhere classified: Secondary | ICD-10-CM | POA: Diagnosis not present

## 2022-06-10 DIAGNOSIS — M2041 Other hammer toe(s) (acquired), right foot: Secondary | ICD-10-CM | POA: Diagnosis not present

## 2022-06-10 DIAGNOSIS — M898X9 Other specified disorders of bone, unspecified site: Secondary | ICD-10-CM | POA: Diagnosis not present

## 2022-06-10 DIAGNOSIS — L600 Ingrowing nail: Secondary | ICD-10-CM | POA: Diagnosis not present

## 2022-06-10 DIAGNOSIS — M2011 Hallux valgus (acquired), right foot: Secondary | ICD-10-CM | POA: Diagnosis not present

## 2022-06-17 DIAGNOSIS — M25552 Pain in left hip: Secondary | ICD-10-CM | POA: Diagnosis not present

## 2022-07-01 ENCOUNTER — Other Ambulatory Visit: Payer: Self-pay | Admitting: Physician Assistant

## 2022-07-01 DIAGNOSIS — D72819 Decreased white blood cell count, unspecified: Secondary | ICD-10-CM | POA: Diagnosis not present

## 2022-07-01 DIAGNOSIS — I10 Essential (primary) hypertension: Secondary | ICD-10-CM | POA: Diagnosis not present

## 2022-07-01 DIAGNOSIS — D696 Thrombocytopenia, unspecified: Secondary | ICD-10-CM | POA: Diagnosis not present

## 2022-07-01 DIAGNOSIS — E7800 Pure hypercholesterolemia, unspecified: Secondary | ICD-10-CM | POA: Diagnosis not present

## 2022-07-01 DIAGNOSIS — M25552 Pain in left hip: Secondary | ICD-10-CM

## 2022-07-08 ENCOUNTER — Ambulatory Visit
Admission: RE | Admit: 2022-07-08 | Discharge: 2022-07-08 | Disposition: A | Discharge status: Home / Self Care | Payer: PPO | Source: Ambulatory Visit | Attending: Physician Assistant | Admitting: Physician Assistant

## 2022-07-08 DIAGNOSIS — M25552 Pain in left hip: Secondary | ICD-10-CM | POA: Diagnosis not present

## 2022-07-22 DIAGNOSIS — M5137 Other intervertebral disc degeneration, lumbosacral region: Secondary | ICD-10-CM | POA: Diagnosis not present

## 2022-07-22 DIAGNOSIS — M47816 Spondylosis without myelopathy or radiculopathy, lumbar region: Secondary | ICD-10-CM | POA: Diagnosis not present

## 2022-07-22 DIAGNOSIS — M25552 Pain in left hip: Secondary | ICD-10-CM | POA: Diagnosis not present

## 2022-07-22 DIAGNOSIS — M1612 Unilateral primary osteoarthritis, left hip: Secondary | ICD-10-CM | POA: Diagnosis not present

## 2022-07-28 DIAGNOSIS — E7800 Pure hypercholesterolemia, unspecified: Secondary | ICD-10-CM | POA: Diagnosis not present

## 2022-07-28 DIAGNOSIS — D72819 Decreased white blood cell count, unspecified: Secondary | ICD-10-CM | POA: Diagnosis not present

## 2022-07-28 DIAGNOSIS — D696 Thrombocytopenia, unspecified: Secondary | ICD-10-CM | POA: Diagnosis not present

## 2022-07-28 DIAGNOSIS — I10 Essential (primary) hypertension: Secondary | ICD-10-CM | POA: Diagnosis not present

## 2022-08-20 IMAGING — MR MR KNEE*L* W/O CM
8 series · 40 of 40 positions shown · non-contrast
Comparison: None.

CLINICAL DATA: Acute onset left calf pain and swelling possibly due
to rupture of a Baker's cyst 1 week ago.

EXAM:
MRI OF THE LEFT KNEE WITHOUT CONTRAST
TECHNIQUE: Multiplanar, multisequence MR imaging of the knee was performed. No
intravenous contrast was administered.

[Series 8: T2 fat-sat · axial · left · 4.0mm · 0.56mm/px · z∈[-74,+91]mm · 5 of 34 slices shown (1 of 3)]
[im 1/34]
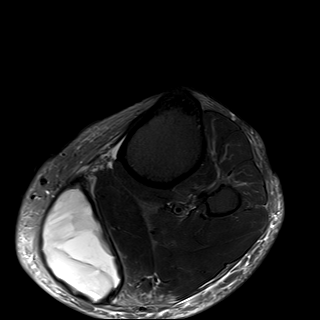
[im 9/34]
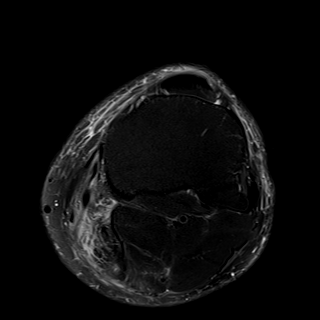
[im 17/34]
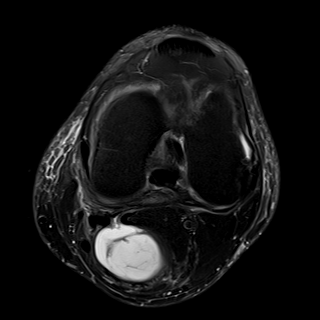
[im 25/34]
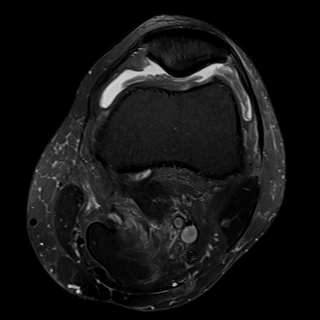
[im 34/34]
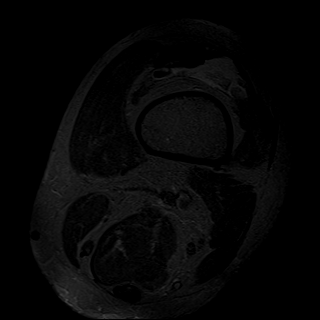

[Series 9: T2 fat-sat · coronal · left · 4.0mm · 0.70mm/px · 5 of 36 slices shown (2 of 3)]
[im 1/36]
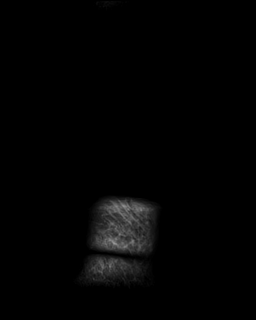
[im 9/36]
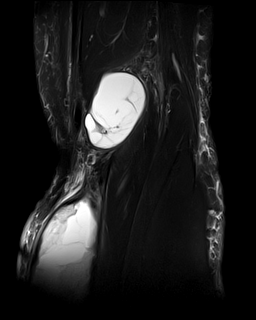
[im 18/36]
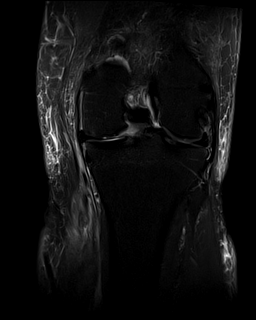
[im 27/36]
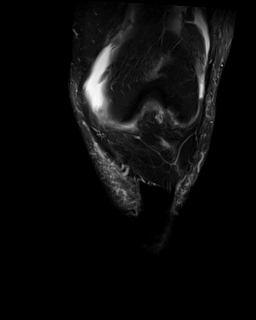
[im 36/36]
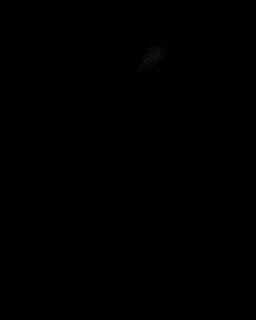

[Series 10: T1 · coronal · left · 4.0mm · 0.70mm/px · 5 of 36 slices shown (1 of 2)]
[im 1/36]
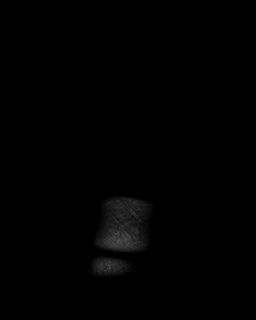
[im 9/36]
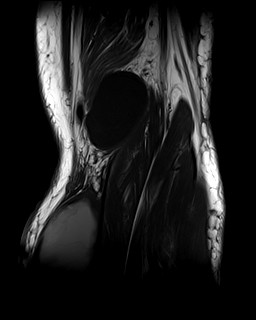
[im 18/36]
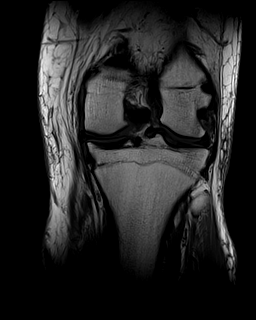
[im 27/36]
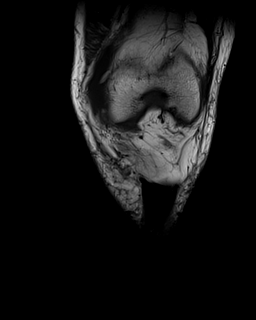
[im 36/36]
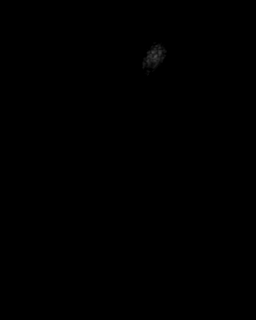

[Series 11: PD fat-sat · coronal · left · 4.0mm · 0.70mm/px · 5 of 36 slices shown (1 of 2)]
[im 1/36]
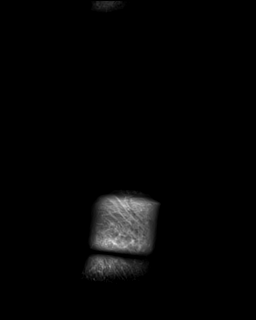
[im 9/36]
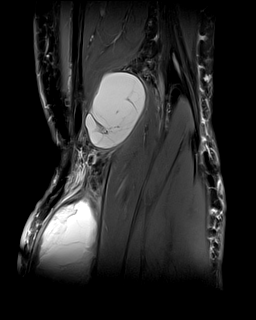
[im 18/36]
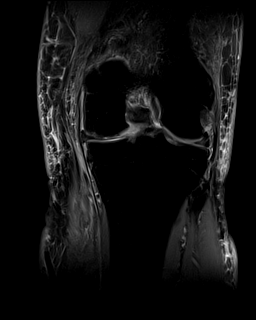
[im 27/36]
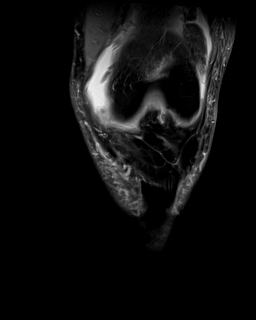
[im 36/36]
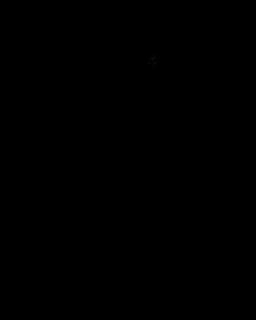

[Series 12: PD fat-sat · sagittal · left · 3.0mm · 0.70mm/px · 6 of 40 slices shown (2 of 2)]
[im 1/40]
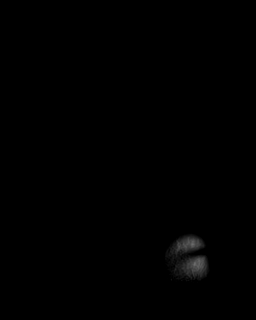
[im 8/40]
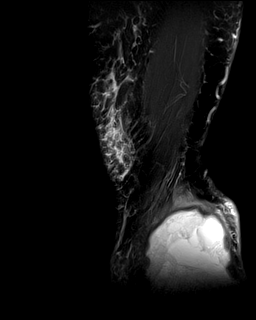
[im 16/40]
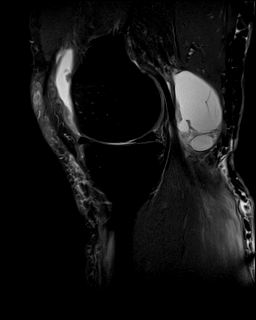
[im 24/40]
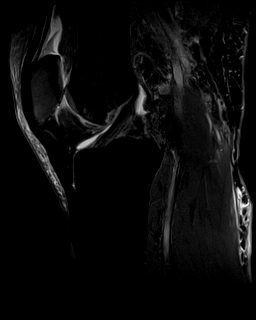
[im 32/40]
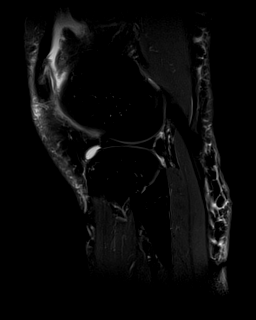
[im 40/40]
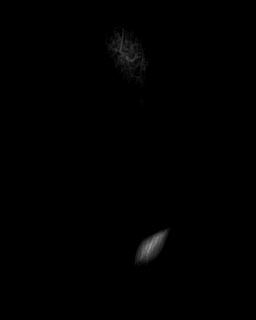

[Series 13: T2 fat-sat · sagittal · left · 3.0mm · 0.70mm/px · 6 of 40 slices shown (3 of 3)]
[im 1/40]
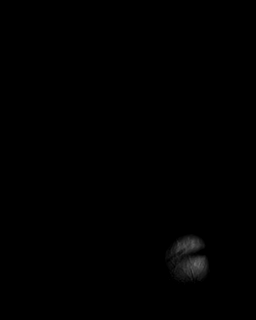
[im 8/40]
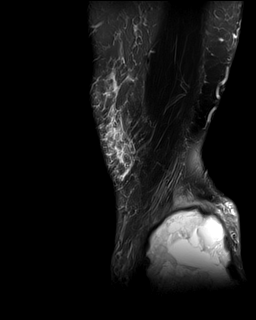
[im 16/40]
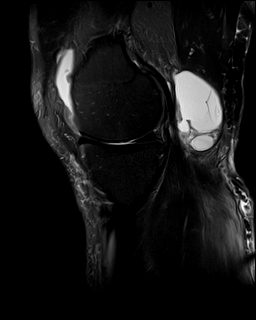
[im 24/40]
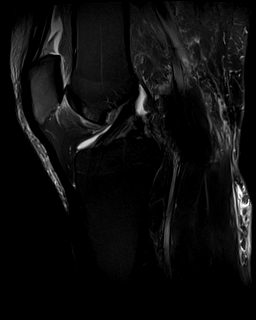
[im 32/40]
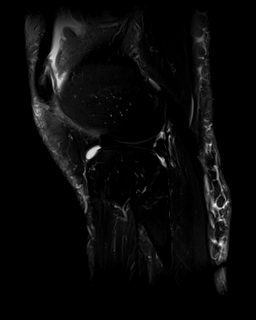
[im 40/40]
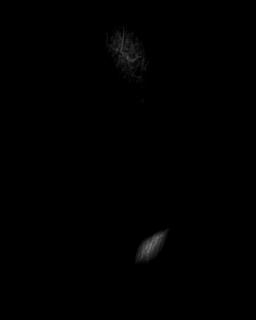

[Series 14: PD · coronal · left · 2.0mm · 0.47mm/px · 2 of 16 slices shown]
[im 1/16]
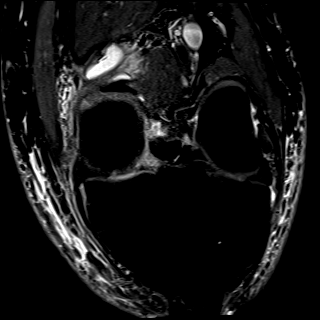
[im 16/16]
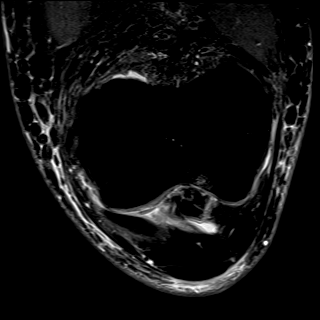

[Series 15: T1 · sagittal · left · 3.0mm · 0.70mm/px · 6 of 40 slices shown (2 of 2)]
[im 1/40]
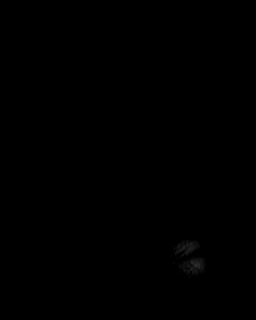
[im 8/40]
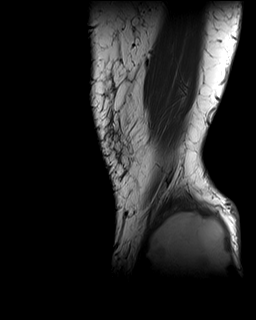
[im 16/40]
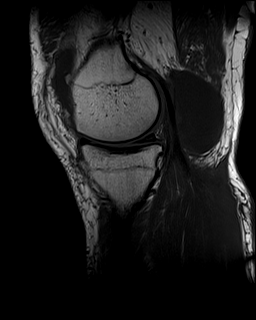
[im 24/40]
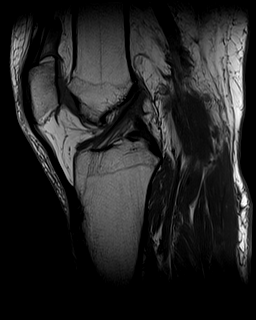
[im 32/40]
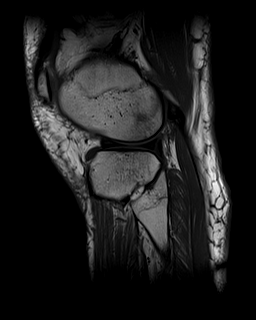
[im 40/40]
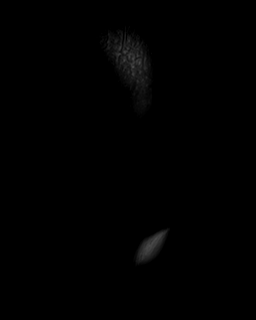

[40 of 40 positions shown; findings below may reference images not displayed]

FINDINGS: MENISCI

Medial meniscus: The body is diminutive and extruded peripherally
with blunting along its free edge throughout.

Lateral meniscus:  Intact.

LIGAMENTS

Cruciates:  Intact.

Collaterals:  Intact.

CARTILAGE

Patellofemoral:  Mildly degenerated.

Medial:  Thinned with associated joint space narrowing.

Lateral:  Mildly degenerated.

Joint:  Small joint effusion.

Popliteal Fossa: The patient has a Baker's cyst with septations
measuring approximately 5 cm transverse by 3.5 cm AP by 6 cm
craniocaudal. No evidence of rupture is identified.

Extensor Mechanism:  Intact.

Bones: No fracture or worrisome lesion. Small osteophytes are seen
about the knee. There is some subchondral edema in the inferior
aspect of the femoral trochlea and in the medial patellar facet.

Other: A collection superficial to the medial gastrocnemius is
partially imaged but measures 6.5 cm transverse by 4 cm AP by at
least 8 cm craniocaudal. The lesion is mixed signal intensity on
both T1 and T2 weighted imaging with extensive T1 hyperintensity
present.
IMPRESSION: Findings most consistent with a large hematoma superficial to the
medial gastrocnemius. The lesion is incompletely imaged on this
study. If it does not resolve, MRI of the lower leg with and without
contrast is recommended.

Septated Baker's cyst measures 5 x 3.5 x 6 cm.

Moderate osteoarthritis about the knee appears worst in the medial
compartment.

Degenerated and diminutive body of the medial meniscus is extruded
peripherally.

## 2022-09-22 DIAGNOSIS — M1990 Unspecified osteoarthritis, unspecified site: Secondary | ICD-10-CM | POA: Diagnosis not present

## 2022-09-22 DIAGNOSIS — I10 Essential (primary) hypertension: Secondary | ICD-10-CM | POA: Diagnosis not present

## 2022-09-22 DIAGNOSIS — Z7982 Long term (current) use of aspirin: Secondary | ICD-10-CM | POA: Diagnosis not present

## 2022-09-22 DIAGNOSIS — E785 Hyperlipidemia, unspecified: Secondary | ICD-10-CM | POA: Diagnosis not present

## 2022-09-22 DIAGNOSIS — G8929 Other chronic pain: Secondary | ICD-10-CM | POA: Diagnosis not present

## 2022-09-24 DIAGNOSIS — E7800 Pure hypercholesterolemia, unspecified: Secondary | ICD-10-CM | POA: Diagnosis not present

## 2022-09-24 DIAGNOSIS — I10 Essential (primary) hypertension: Secondary | ICD-10-CM | POA: Diagnosis not present

## 2022-09-24 DIAGNOSIS — D696 Thrombocytopenia, unspecified: Secondary | ICD-10-CM | POA: Diagnosis not present

## 2022-09-24 DIAGNOSIS — D72819 Decreased white blood cell count, unspecified: Secondary | ICD-10-CM | POA: Diagnosis not present

## 2022-10-03 DIAGNOSIS — M79675 Pain in left toe(s): Secondary | ICD-10-CM | POA: Diagnosis not present

## 2022-10-03 DIAGNOSIS — M2012 Hallux valgus (acquired), left foot: Secondary | ICD-10-CM | POA: Diagnosis not present

## 2022-10-03 DIAGNOSIS — M898X9 Other specified disorders of bone, unspecified site: Secondary | ICD-10-CM | POA: Diagnosis not present

## 2022-10-03 DIAGNOSIS — L600 Ingrowing nail: Secondary | ICD-10-CM | POA: Diagnosis not present

## 2022-10-03 DIAGNOSIS — M2041 Other hammer toe(s) (acquired), right foot: Secondary | ICD-10-CM | POA: Diagnosis not present

## 2022-10-03 DIAGNOSIS — M778 Other enthesopathies, not elsewhere classified: Secondary | ICD-10-CM | POA: Diagnosis not present

## 2022-10-03 DIAGNOSIS — M79674 Pain in right toe(s): Secondary | ICD-10-CM | POA: Diagnosis not present

## 2022-10-03 DIAGNOSIS — B351 Tinea unguium: Secondary | ICD-10-CM | POA: Diagnosis not present

## 2022-10-03 DIAGNOSIS — M2011 Hallux valgus (acquired), right foot: Secondary | ICD-10-CM | POA: Diagnosis not present

## 2022-10-03 DIAGNOSIS — M2042 Other hammer toe(s) (acquired), left foot: Secondary | ICD-10-CM | POA: Diagnosis not present

## 2022-10-21 DIAGNOSIS — E7800 Pure hypercholesterolemia, unspecified: Secondary | ICD-10-CM | POA: Diagnosis not present

## 2022-10-30 DIAGNOSIS — E7800 Pure hypercholesterolemia, unspecified: Secondary | ICD-10-CM | POA: Diagnosis not present

## 2022-10-30 DIAGNOSIS — Z0000 Encounter for general adult medical examination without abnormal findings: Secondary | ICD-10-CM | POA: Diagnosis not present

## 2022-10-30 DIAGNOSIS — I10 Essential (primary) hypertension: Secondary | ICD-10-CM | POA: Diagnosis not present

## 2022-11-06 DIAGNOSIS — M25552 Pain in left hip: Secondary | ICD-10-CM | POA: Diagnosis not present

## 2022-11-06 DIAGNOSIS — M25551 Pain in right hip: Secondary | ICD-10-CM | POA: Diagnosis not present

## 2022-11-24 DIAGNOSIS — M1612 Unilateral primary osteoarthritis, left hip: Secondary | ICD-10-CM | POA: Diagnosis not present

## 2022-12-17 DIAGNOSIS — R7309 Other abnormal glucose: Secondary | ICD-10-CM | POA: Diagnosis not present

## 2022-12-17 DIAGNOSIS — M1612 Unilateral primary osteoarthritis, left hip: Secondary | ICD-10-CM | POA: Diagnosis not present

## 2022-12-18 DIAGNOSIS — R7309 Other abnormal glucose: Secondary | ICD-10-CM | POA: Diagnosis not present

## 2022-12-18 DIAGNOSIS — M1612 Unilateral primary osteoarthritis, left hip: Secondary | ICD-10-CM | POA: Diagnosis not present

## 2022-12-31 ENCOUNTER — Other Ambulatory Visit: Payer: Self-pay | Admitting: Orthopedic Surgery

## 2023-01-07 ENCOUNTER — Other Ambulatory Visit: Payer: Self-pay

## 2023-01-07 ENCOUNTER — Encounter
Admission: RE | Admit: 2023-01-07 | Discharge: 2023-01-07 | Disposition: A | Discharge status: Home / Self Care | Payer: PPO | Source: Ambulatory Visit | Attending: Orthopedic Surgery | Admitting: Orthopedic Surgery

## 2023-01-07 VITALS — Ht 77.0 in | Wt 262.3 lb

## 2023-01-07 DIAGNOSIS — Z01812 Encounter for preprocedural laboratory examination: Secondary | ICD-10-CM

## 2023-01-07 DIAGNOSIS — Z01818 Encounter for other preprocedural examination: Secondary | ICD-10-CM | POA: Insufficient documentation

## 2023-01-07 DIAGNOSIS — Z01810 Encounter for preprocedural cardiovascular examination: Secondary | ICD-10-CM | POA: Diagnosis present

## 2023-01-07 HISTORY — DX: Thrombocytopenia, unspecified: D69.6

## 2023-01-07 HISTORY — DX: Unspecified osteoarthritis, unspecified site: M19.90

## 2023-01-07 HISTORY — DX: Chronic kidney disease, unspecified: N18.9

## 2023-01-07 HISTORY — DX: Decreased white blood cell count, unspecified: D72.819

## 2023-01-07 HISTORY — DX: Localized edema: R60.0

## 2023-01-07 HISTORY — DX: Other neuromuscular dysfunction of bladder: N31.8

## 2023-01-07 LAB — COMPREHENSIVE METABOLIC PANEL
ALT: 14 U/L (ref 0–44)
AST: 20 U/L (ref 15–41)
Albumin: 4 g/dL (ref 3.5–5.0)
Alkaline Phosphatase: 50 U/L (ref 38–126)
Anion gap: 6 (ref 5–15)
BUN: 21 mg/dL (ref 8–23)
CO2: 29 mmol/L (ref 22–32)
Calcium: 8.9 mg/dL (ref 8.9–10.3)
Chloride: 105 mmol/L (ref 98–111)
Creatinine, Ser: 1.26 mg/dL — ABNORMAL HIGH (ref 0.61–1.24)
GFR, Estimated: >60 mL/min (ref >60)
Glucose, Bld: 75 mg/dL (ref 70–99)
Potassium: 3.8 mmol/L (ref 3.5–5.1)
Sodium: 140 mmol/L (ref 135–145)
Total Bilirubin: 0.6 mg/dL (ref <1.2)
Total Protein: 7.3 g/dL (ref 6.5–8.1)

## 2023-01-07 LAB — URINALYSIS, ROUTINE W REFLEX MICROSCOPIC
Bilirubin Urine: NEGATIVE
Glucose, UA: NEGATIVE mg/dL
Hgb urine dipstick: NEGATIVE
Ketones, ur: NEGATIVE mg/dL
Leukocytes,Ua: NEGATIVE
Nitrite: NEGATIVE
Protein, ur: NEGATIVE mg/dL
Specific Gravity, Urine: 1.012 (ref 1.005–1.030)
pH: 6 (ref 5.0–8.0)

## 2023-01-07 LAB — CBC WITH DIFFERENTIAL/PLATELET
Abs Immature Granulocytes: 0 10*3/uL (ref 0.00–0.07)
Basophils Absolute: 0 10*3/uL (ref 0.0–0.1)
Basophils Relative: 0 %
Eosinophils Absolute: 0.2 10*3/uL (ref 0.0–0.5)
Eosinophils Relative: 6 %
HCT: 40.4 % (ref 39.0–52.0)
Hemoglobin: 13.2 g/dL (ref 13.0–17.0)
Immature Granulocytes: 0 %
Lymphocytes Relative: 47 %
Lymphs Abs: 1.4 10*3/uL (ref 0.7–4.0)
MCH: 28 pg (ref 26.0–34.0)
MCHC: 32.7 g/dL (ref 30.0–36.0)
MCV: 85.6 fL (ref 80.0–100.0)
Monocytes Absolute: 0.3 10*3/uL (ref 0.1–1.0)
Monocytes Relative: 11 %
Neutro Abs: 1.1 10*3/uL — ABNORMAL LOW (ref 1.7–7.7)
Neutrophils Relative %: 36 %
Platelets: 156 10*3/uL (ref 150–400)
RBC: 4.72 MIL/uL (ref 4.22–5.81)
RDW: 14.2 % (ref 11.5–15.5)
WBC: 3 10*3/uL — ABNORMAL LOW (ref 4.0–10.5)
nRBC: 0 % (ref 0.0–0.2)

## 2023-01-07 LAB — SURGICAL PCR SCREEN
MRSA, PCR: NEGATIVE
Staphylococcus aureus: NEGATIVE

## 2023-01-07 LAB — TYPE AND SCREEN
ABO/RH(D): O POS
Antibody Screen: NEGATIVE

## 2023-01-07 NOTE — Patient Instructions (Addendum)
Your procedure is scheduled on: Thursday November 21  Report to the Registration Desk on the 1st floor of the [facility] To find out your arrival time, please call [phone] between 1PM - 3PM on:  Wednesday November 20  If your arrival time is 6:00 am, do not arrive before that time as the [facility] entrance doors do not open until 6:00 am.  REMEMBER: Instructions that are not followed completely may result in serious medical risk, up to and including death; or upon the discretion of your surgeon and anesthesiologist your surgery may need to be rescheduled.  Do not eat food after midnight the night before surgery.  No gum chewing or hard candies.  You may however, drink CLEAR liquids up to 3 hours before you are scheduled to arrive for your surgery. Do not drink anything within 2 hours of your scheduled arrival time.  Clear liquids include: - water  - apple juice without pulp - gatorade (not RED colors) - black coffee or tea (Do NOT add milk or creamers to the coffee or tea) Do NOT drink anything that is not on this list.  In addition, your doctor has ordered for you to drink the provided:  Ensure Pre-Surgery Clear Carbohydrate Drink  Drinking this carbohydrate drink up to two hours before surgery helps to reduce insulin resistance and improve patient outcomes. Please complete drinking 3 hours before scheduled arrival time.  One week prior to surgery:  Thursday November 14  Stop Anti-inflammatories (NSAIDS) such as Advil, Aleve, Ibuprofen, Motrin, Naproxen, Naprosyn and Aspirin based products such as Excedrin, Goody's Powder, BC Powder.  celecoxib (CELEBREX)  Stop ANY OVER THE COUNTER supplements until after surgery.  You may however, continue to take Tylenol if needed for pain up until the day of surgery. Apple Cider Vinegar  Ascorbic Acid (VITAMIN C ADULT GUMMIES PO)  Calcium-Phosphorus-Vitamin D (CALCIUM GUMMIES)  Cinnamon  Coenzyme Q10  Multiple Vitamins-Minerals  (MULTIVITAMIN ADULT)  Omega-3 Fatty Acids (FISH OIL ADULT GUMMIES PO)  Chaga Mushroom  Turmeric  vitamin B-12 (CYANOCOBALAMIN)  Zinc Citrate   Continue taking all of your other prescription medications up until the day of surgery.  ON THE DAY OF SURGERY ONLY TAKE THESE MEDICATIONS WITH SIPS OF WATER:  pravastatin (PRAVACHOL)   No Alcohol for 24 hours before or after surgery.  No Smoking including e-cigarettes for 24 hours before surgery.  No chewable tobacco products for at least 6 hours before surgery.  No nicotine patches on the day of surgery.  Do not use any "recreational" drugs for at least a week (preferably 2 weeks) before your surgery.  Please be advised that the combination of cocaine and anesthesia may have negative outcomes, up to and including death. If you test positive for cocaine, your surgery will be cancelled.  On the morning of surgery brush your teeth with toothpaste and water, you may rinse your mouth with mouthwash if you wish. Do not swallow any toothpaste or mouthwash.  Use CHG Soap as directed on instruction sheet.  Do not wear jewelry, make-up, hairpins, clips or nail polish.  For welded (permanent) jewelry: bracelets, anklets, waist bands, etc.  Please have this removed prior to surgery.  If it is not removed, there is a chance that hospital personnel will need to cut it off on the day of surgery.  Do not wear lotions, powders, or perfumes.   Do not shave body hair from the neck down 48 hours before surgery.  Do not bring valuables to  the hospital. [facility] is not responsible for any missing/lost belongings or valuables.   Notify your doctor if there is any change in your medical condition (cold, fever, infection).  Wear comfortable clothing (specific to your surgery type) to the hospital.  After surgery, you can help prevent lung complications by doing breathing exercises.  Take deep breaths and cough every 1-2 hours. Your doctor may order a  device called an Incentive Spirometer to help you take deep breaths.  If you are being admitted to the hospital overnight, leave your suitcase in the car. After surgery it may be brought to your room.  In case of increased patient census, it may be necessary for you, the patient, to continue your postoperative care in the Same Day Surgery department.  If you are being discharged the day of surgery, you will not be allowed to drive home. You will need a responsible individual to drive you home and stay with you for 24 hours after surgery.   If you are taking public transportation, you will need to have a responsible individual with you.  Please call the Pre-admissions Testing Dept. at [phone] if you have any questions about these instructions.  Surgery Visitation Policy:  Patients having surgery or a procedure may have two visitors.  Children under the age of 17 must have an adult with them who is not the patient.  Inpatient Visitation:    Visiting hours are 7 a.m. to 8 p.m. Up to four visitors are allowed at one time in a patient room. The visitors may rotate out with other people during the day.  One visitor age 84 or older may stay with the patient overnight and must be in the room by 8 p.m.               Pre-operative 5 CHG Bath Instructions   You can play a key role in reducing the risk of infection after surgery. Your skin needs to be as free of germs as possible. You can reduce the number of germs on your skin by washing with CHG (chlorhexidine gluconate) soap before surgery. CHG is an antiseptic soap that kills germs and continues to kill germs even after washing.   DO NOT use if you have an allergy to chlorhexidine/CHG or antibacterial soaps. If your skin becomes reddened or irritated, stop using the CHG and notify one of our RNs at [phone].   Please shower with the CHG soap starting 4 days before surgery using the following schedule:     Please keep  in mind the following:  DO NOT shave, including legs and underarms, starting the day of your first shower.   You may shave your face at any point before/day of surgery.  Place clean sheets on your bed the day you start using CHG soap. Use a clean washcloth (not used since being washed) for each shower. DO NOT sleep with pets once you start using the CHG.   CHG Shower Instructions:  If you choose to wash your hair and private area, wash first with your normal shampoo/soap.  After you use shampoo/soap, rinse your hair and body thoroughly to remove shampoo/soap residue.  Turn the water OFF and apply about 3 tablespoons (45 ml) of CHG soap to a CLEAN washcloth.  Apply CHG soap ONLY FROM YOUR NECK DOWN TO YOUR TOES (washing for 3-5 minutes)  DO NOT use CHG soap on face, private areas, open wounds, or sores.  Pay special attention to the area where  your surgery is being performed.  If you are having back surgery, having someone wash your back for you may be helpful. Wait 2 minutes after CHG soap is applied, then you may rinse off the CHG soap.  Pat dry with a clean towel  Put on clean clothes/pajamas   If you choose to wear lotion, please use ONLY the CHG-compatible lotions on the back of this paper.     Additional instructions for the day of surgery: DO NOT APPLY any lotions, deodorants, cologne, or perfumes.   Put on clean/comfortable clothes.  Brush your teeth.  Ask your nurse before applying any prescription medications to the skin.      CHG Compatible Lotions   Aveeno Moisturizing lotion  Cetaphil Moisturizing Cream  Cetaphil Moisturizing Lotion  Clairol Herbal Essence Moisturizing Lotion, Dry Skin  Clairol Herbal Essence Moisturizing Lotion, Extra Dry Skin  Clairol Herbal Essence Moisturizing Lotion, Normal Skin  Curel Age Defying Therapeutic Moisturizing Lotion with Alpha Hydroxy  Curel Extreme Care Body Lotion  Curel Soothing Hands Moisturizing Hand Lotion  Curel  Therapeutic Moisturizing Cream, Fragrance-Free  Curel Therapeutic Moisturizing Lotion, Fragrance-Free  Curel Therapeutic Moisturizing Lotion, Original Formula  Eucerin Daily Replenishing Lotion  Eucerin Dry Skin Therapy Plus Alpha Hydroxy Crme  Eucerin Dry Skin Therapy Plus Alpha Hydroxy Lotion  Eucerin Original Crme  Eucerin Original Lotion  Eucerin Plus Crme Eucerin Plus Lotion  Eucerin TriLipid Replenishing Lotion  Keri Anti-Bacterial Hand Lotion  Keri Deep Conditioning Original Lotion Dry Skin Formula Softly Scented  Keri Deep Conditioning Original Lotion, Fragrance Free Sensitive Skin Formula  Keri Lotion Fast Absorbing Fragrance Free Sensitive Skin Formula  Keri Lotion Fast Absorbing Softly Scented Dry Skin Formula  Keri Original Lotion  Keri Skin Renewal Lotion Keri Silky Smooth Lotion  Keri Silky Smooth Sensitive Skin Lotion  Nivea Body Creamy Conditioning Oil  Nivea Body Extra Enriched Lotion  Nivea Body Original Lotion  Nivea Body Sheer Moisturizing Lotion Nivea Crme  Nivea Skin Firming Lotion  NutraDerm 30 Skin Lotion  NutraDerm Skin Lotion  NutraDerm Therapeutic Skin Cream  NutraDerm Therapeutic Skin Lotion  ProShield Protective Hand Cream  Provon moisturizing lotion         Preoperative Educational Videos for Total Hip, Knee and Shoulder Replacements  To better prepare for surgery, please view our videos that explain the physical activity and discharge planning required to have the best surgical recovery at [facility].  [email or web address]  Questions? Call [phone] or email [email or web address]       How to Use an Incentive Spirometer  An incentive spirometer is a tool that measures how well you are filling your lungs with each breath. Learning to take long, deep breaths using this tool can help you keep your lungs clear and active. This may help to reverse or lessen your chance of developing  breathing (pulmonary) problems, especially infection. You may be asked to use a spirometer: After a surgery. If you have a lung problem or a history of smoking. After a long period of time when you have been unable to move or be active. If the spirometer includes an indicator to show the highest number that you have reached, your health care provider or respiratory therapist will help you set a goal. Keep a log of your progress as told by your health care provider. What are the risks? Breathing too quickly may cause dizziness or cause you to pass out. Take your time so you do not get dizzy or light-headed. If you  are in pain, you may need to take pain medicine before doing incentive spirometry. It is harder to take a deep breath if you are having pain. How to use your incentive spirometer  Sit up on the edge of your bed or on a chair. Hold the incentive spirometer so that it is in an upright position. Before you use the spirometer, breathe out normally. Place the mouthpiece in your mouth. Make sure your lips are closed tightly around it. Breathe in slowly and as deeply as you can through your mouth, causing the piston or the ball to rise toward the top of the chamber. Hold your breath for 3-5 seconds, or for as long as possible. If the spirometer includes a coach indicator, use this to guide you in breathing. Slow down your breathing if the indicator goes above the marked areas. Remove the mouthpiece from your mouth and breathe out normally. The piston or ball will return to the bottom of the chamber. Rest for a few seconds, then repeat the steps 10 or more times. Take your time and take a few normal breaths between deep breaths so that you do not get dizzy or light-headed. Do this every 1-2 hours when you are awake. If the spirometer includes a goal marker to show the highest number you have reached (best effort), use this as a goal to work toward during each repetition. After each set of 10  deep breaths, cough a few times. This will help to make sure that your lungs are clear. If you have an incision on your chest or abdomen from surgery, place a pillow or a rolled-up towel firmly against the incision when you cough. This can help to reduce pain while taking deep breaths and coughing. General tips When you are able to get out of bed: Walk around often. Continue to take deep breaths and cough in order to clear your lungs. Keep using the incentive spirometer until your health care provider says it is okay to stop using it. If you have been in the hospital, you may be told to keep using the spirometer at home. Contact a health care provider if: You are having difficulty using the spirometer. You have trouble using the spirometer as often as instructed. Your pain medicine is not giving enough relief for you to use the spirometer as told. You have a fever. Get help right away if: You develop shortness of breath. You develop a cough with bloody mucus from the lungs. You have fluid or blood coming from an incision site after you cough. Summary An incentive spirometer is a tool that can help you learn to take long, deep breaths to keep your lungs clear and active. You may be asked to use a spirometer after a surgery, if you have a lung problem or a history of smoking, or if you have been inactive for a long period of time. Use your incentive spirometer as instructed every 1-2 hours while you are awake. If you have an incision on your chest or abdomen, place a pillow or a rolled-up towel firmly against your incision when you cough. This will help to reduce pain. Get help right away if you have shortness of breath, you cough up bloody mucus, or blood comes from your incision when you cough. This information is not intended to replace advice given to you by your health care provider. Make sure you discuss any questions you have with your health care provider. Document Revised: 05/02/2019  Document Reviewed: 05/02/2019 Elsevier  Patient Education  2023 [employer].

## 2023-01-13 DIAGNOSIS — N289 Disorder of kidney and ureter, unspecified: Secondary | ICD-10-CM | POA: Diagnosis not present

## 2023-01-13 DIAGNOSIS — M25552 Pain in left hip: Secondary | ICD-10-CM | POA: Diagnosis not present

## 2023-01-13 DIAGNOSIS — I10 Essential (primary) hypertension: Secondary | ICD-10-CM | POA: Diagnosis not present

## 2023-01-13 DIAGNOSIS — Z01818 Encounter for other preprocedural examination: Secondary | ICD-10-CM | POA: Diagnosis not present

## 2023-01-13 DIAGNOSIS — M25551 Pain in right hip: Secondary | ICD-10-CM | POA: Diagnosis not present

## 2023-01-14 MED ORDER — TRANEXAMIC ACID-NACL 1000-0.7 MG/100ML-% IV SOLN
1000.0000 mg | INTRAVENOUS | Status: AC
  Administered 2023-01-15 (×2): 1000 mg via INTRAVENOUS

## 2023-01-14 MED ORDER — CEFAZOLIN SODIUM-DEXTROSE 2-4 GM/100ML-% IV SOLN
2.0000 g | INTRAVENOUS | Status: AC
  Administered 2023-01-15: 2 g via INTRAVENOUS

## 2023-01-14 MED ORDER — ORAL CARE MOUTH RINSE
15.0000 mL | Freq: Once | OROMUCOSAL | Status: AC
Start: 2023-01-14 — End: 2023-01-15

## 2023-01-14 MED ORDER — DEXAMETHASONE SODIUM PHOSPHATE 10 MG/ML IJ SOLN
8.0000 mg | Freq: Once | INTRAMUSCULAR | Status: AC
  Administered 2023-01-15: 8 mg via INTRAVENOUS

## 2023-01-14 MED ORDER — LACTATED RINGERS IV SOLN
INTRAVENOUS | Status: DC
Start: 2023-01-14 — End: 2023-01-15

## 2023-01-14 MED ORDER — CHLORHEXIDINE GLUCONATE 0.12 % MT SOLN
15.0000 mL | Freq: Once | OROMUCOSAL | Status: AC
Start: 2023-01-14 — End: 2023-01-15
  Administered 2023-01-15: 15 mL via OROMUCOSAL

## 2023-01-15 ENCOUNTER — Other Ambulatory Visit: Payer: Self-pay

## 2023-01-15 ENCOUNTER — Ambulatory Visit: Payer: PPO | Admitting: General Practice

## 2023-01-15 ENCOUNTER — Ambulatory Visit: Payer: PPO

## 2023-01-15 ENCOUNTER — Encounter
Admission: RE | Disposition: A | Discharge status: Home / Self Care | Payer: Self-pay | Source: Home / Self Care | Attending: Orthopedic Surgery

## 2023-01-15 ENCOUNTER — Ambulatory Visit: Payer: PPO | Admitting: Urgent Care

## 2023-01-15 ENCOUNTER — Encounter: Payer: Self-pay | Admitting: Orthopedic Surgery

## 2023-01-15 ENCOUNTER — Observation Stay
Admission: RE | Admit: 2023-01-15 | Discharge: 2023-01-16 | Disposition: A | Discharge status: Home / Self Care | Payer: PPO | Attending: Orthopedic Surgery | Admitting: Orthopedic Surgery

## 2023-01-15 DIAGNOSIS — Z7982 Long term (current) use of aspirin: Secondary | ICD-10-CM | POA: Diagnosis not present

## 2023-01-15 DIAGNOSIS — N1831 Chronic kidney disease, stage 3a: Secondary | ICD-10-CM | POA: Insufficient documentation

## 2023-01-15 DIAGNOSIS — Z79899 Other long term (current) drug therapy: Secondary | ICD-10-CM | POA: Insufficient documentation

## 2023-01-15 DIAGNOSIS — M1612 Unilateral primary osteoarthritis, left hip: Principal | ICD-10-CM | POA: Insufficient documentation

## 2023-01-15 DIAGNOSIS — I129 Hypertensive chronic kidney disease with stage 1 through stage 4 chronic kidney disease, or unspecified chronic kidney disease: Secondary | ICD-10-CM | POA: Diagnosis not present

## 2023-01-15 DIAGNOSIS — Z96642 Presence of left artificial hip joint: Principal | ICD-10-CM | POA: Diagnosis present

## 2023-01-15 HISTORY — PX: TOTAL HIP ARTHROPLASTY: SHX124

## 2023-01-15 LAB — ABO/RH: ABO/RH(D): O POS

## 2023-01-15 SURGERY — ARTHROPLASTY, HIP, TOTAL, ANTERIOR APPROACH
Anesthesia: Spinal | Site: Hip | Laterality: Left

## 2023-01-15 MED ORDER — FENTANYL CITRATE (PF) 100 MCG/2ML IJ SOLN
INTRAMUSCULAR | Status: AC
  Filled 2023-01-15: qty 2

## 2023-01-15 MED ORDER — TRAMADOL HCL 50 MG PO TABS: 50.0000 mg | ORAL_TABLET | Freq: Four times a day (QID) | ORAL | Status: DC | PRN

## 2023-01-15 MED ORDER — ONDANSETRON HCL 4 MG/2ML IJ SOLN
INTRAMUSCULAR | Status: DC | PRN
  Administered 2023-01-15: 4 mg via INTRAVENOUS

## 2023-01-15 MED ORDER — PHENYLEPHRINE 80 MCG/ML (10ML) SYRINGE FOR IV PUSH (FOR BLOOD PRESSURE SUPPORT)
PREFILLED_SYRINGE | INTRAVENOUS | Status: AC
Start: 2023-01-15 — End: ?
  Filled 2023-01-15: qty 10

## 2023-01-15 MED ORDER — MIDAZOLAM HCL 5 MG/5ML IJ SOLN
INTRAMUSCULAR | Status: DC | PRN
  Administered 2023-01-15: 2 mg via INTRAVENOUS

## 2023-01-15 MED ORDER — KETOROLAC TROMETHAMINE 15 MG/ML IJ SOLN
15.0000 mg | Freq: Four times a day (QID) | INTRAMUSCULAR | Status: AC
  Administered 2023-01-15 – 2023-01-16 (×4): 15 mg via INTRAVENOUS

## 2023-01-15 MED ORDER — KETOROLAC TROMETHAMINE 15 MG/ML IJ SOLN
INTRAMUSCULAR | Status: AC
  Filled 2023-01-15: qty 1

## 2023-01-15 MED ORDER — MIDAZOLAM HCL 2 MG/2ML IJ SOLN
INTRAMUSCULAR | Status: AC
  Filled 2023-01-15: qty 2

## 2023-01-15 MED ORDER — BUPIVACAINE HCL (PF) 0.5 % IJ SOLN
INTRAMUSCULAR | Status: DC | PRN
  Administered 2023-01-15: 3 mL via INTRATHECAL

## 2023-01-15 MED ORDER — CHLORHEXIDINE GLUCONATE 0.12 % MT SOLN
OROMUCOSAL | Status: AC
Start: 2023-01-15 — End: ?
  Filled 2023-01-15: qty 15

## 2023-01-15 MED ORDER — FENTANYL CITRATE (PF) 100 MCG/2ML IJ SOLN: 25.0000 ug | INTRAMUSCULAR | Status: DC | PRN

## 2023-01-15 MED ORDER — PANTOPRAZOLE SODIUM 40 MG PO TBEC
DELAYED_RELEASE_TABLET | ORAL | Status: AC
  Filled 2023-01-15: qty 1

## 2023-01-15 MED ORDER — DOCUSATE SODIUM 100 MG PO CAPS
ORAL_CAPSULE | ORAL | Status: AC
  Filled 2023-01-15: qty 1

## 2023-01-15 MED ORDER — TRANEXAMIC ACID-NACL 1000-0.7 MG/100ML-% IV SOLN
INTRAVENOUS | Status: AC
Start: 2023-01-15 — End: ?
  Filled 2023-01-15: qty 100

## 2023-01-15 MED ORDER — DEXAMETHASONE SODIUM PHOSPHATE 10 MG/ML IJ SOLN
INTRAMUSCULAR | Status: AC
Start: 2023-01-15 — End: ?
  Filled 2023-01-15: qty 1

## 2023-01-15 MED ORDER — SODIUM CHLORIDE 0.9 % IV SOLN: INTRAVENOUS | Status: DC

## 2023-01-15 MED ORDER — SURGIPHOR WOUND IRRIGATION SYSTEM - OPTIME: TOPICAL | Status: DC | PRN

## 2023-01-15 MED ORDER — DOCUSATE SODIUM 100 MG PO CAPS
100.0000 mg | ORAL_CAPSULE | Freq: Two times a day (BID) | ORAL | Status: DC
Start: 2023-01-15 — End: 2023-01-16
  Administered 2023-01-15 – 2023-01-16 (×2): 100 mg via ORAL

## 2023-01-15 MED ORDER — PROPOFOL 500 MG/50ML IV EMUL
INTRAVENOUS | Status: DC | PRN
  Administered 2023-01-15: 50 ug/kg/min via INTRAVENOUS

## 2023-01-15 MED ORDER — MORPHINE SULFATE (PF) 4 MG/ML IV SOLN
0.5000 mg | INTRAVENOUS | Status: DC | PRN
Start: 2023-01-15 — End: 2023-01-16

## 2023-01-15 MED ORDER — BUPIVACAINE-EPINEPHRINE (PF) 0.25% -1:200000 IJ SOLN
INTRAMUSCULAR | Status: AC
Start: 2023-01-15 — End: ?
  Filled 2023-01-15: qty 90

## 2023-01-15 MED ORDER — PRAVASTATIN SODIUM 40 MG PO TABS
ORAL_TABLET | ORAL | Status: AC
Start: 2023-01-15 — End: ?
  Filled 2023-01-15: qty 1

## 2023-01-15 MED ORDER — PHENYLEPHRINE HCL-NACL 20-0.9 MG/250ML-% IV SOLN
INTRAVENOUS | Status: DC | PRN
  Administered 2023-01-15: 40 ug/min via INTRAVENOUS

## 2023-01-15 MED ORDER — HYDROCODONE-ACETAMINOPHEN 5-325 MG PO TABS: 1.0000 | ORAL_TABLET | ORAL | Status: DC | PRN

## 2023-01-15 MED ORDER — OXYCODONE HCL 5 MG PO TABS: 5.0000 mg | ORAL_TABLET | Freq: Once | ORAL | Status: DC | PRN

## 2023-01-15 MED ORDER — SODIUM CHLORIDE (PF) 0.9 % IJ SOLN
INTRAMUSCULAR | Status: DC | PRN
  Administered 2023-01-15: 50 mL via INTRAMUSCULAR

## 2023-01-15 MED ORDER — PRAVASTATIN SODIUM 40 MG PO TABS
20.0000 mg | ORAL_TABLET | Freq: Every day | ORAL | Status: DC
  Administered 2023-01-15 – 2023-01-16 (×2): 20 mg via ORAL

## 2023-01-15 MED ORDER — HYDROCHLOROTHIAZIDE 25 MG PO TABS
ORAL_TABLET | ORAL | Status: AC
  Filled 2023-01-15: qty 1

## 2023-01-15 MED ORDER — OXYCODONE HCL 5 MG/5ML PO SOLN: 5.0000 mg | Freq: Once | ORAL | Status: DC | PRN

## 2023-01-15 MED ORDER — GLYCOPYRROLATE 0.2 MG/ML IJ SOLN
INTRAMUSCULAR | Status: DC | PRN
  Administered 2023-01-15: .2 mg via INTRAVENOUS

## 2023-01-15 MED ORDER — BENAZEPRIL HCL 20 MG PO TABS
40.0000 mg | ORAL_TABLET | Freq: Every day | ORAL | Status: DC
  Administered 2023-01-16: 40 mg via ORAL
  Filled 2023-01-15: qty 2

## 2023-01-15 MED ORDER — PANTOPRAZOLE SODIUM 40 MG PO TBEC
40.0000 mg | DELAYED_RELEASE_TABLET | Freq: Every day | ORAL | Status: DC
  Administered 2023-01-15 – 2023-01-16 (×2): 40 mg via ORAL

## 2023-01-15 MED ORDER — CEFAZOLIN SODIUM-DEXTROSE 2-4 GM/100ML-% IV SOLN
INTRAVENOUS | Status: AC
  Filled 2023-01-15: qty 100

## 2023-01-15 MED ORDER — BUPIVACAINE LIPOSOME 1.3 % IJ SUSP
INTRAMUSCULAR | Status: AC
Start: 2023-01-15 — End: ?
  Filled 2023-01-15: qty 60

## 2023-01-15 MED ORDER — MAGNESIUM OXIDE -MG SUPPLEMENT 400 (240 MG) MG PO TABS
400.0000 mg | ORAL_TABLET | Freq: Every day | ORAL | Status: DC
  Administered 2023-01-16: 400 mg via ORAL
  Filled 2023-01-15: qty 1

## 2023-01-15 MED ORDER — FENTANYL CITRATE (PF) 100 MCG/2ML IJ SOLN
INTRAMUSCULAR | Status: DC | PRN
  Administered 2023-01-15 (×2): 50 ug via INTRAVENOUS

## 2023-01-15 MED ORDER — DEXAMETHASONE SODIUM PHOSPHATE 10 MG/ML IJ SOLN
INTRAMUSCULAR | Status: AC
  Filled 2023-01-15: qty 1

## 2023-01-15 MED ORDER — METOCLOPRAMIDE HCL 5 MG PO TABS: 5.0000 mg | ORAL_TABLET | Freq: Three times a day (TID) | ORAL | Status: DC | PRN

## 2023-01-15 MED ORDER — ACETAMINOPHEN 500 MG PO TABS
1000.0000 mg | ORAL_TABLET | Freq: Three times a day (TID) | ORAL | Status: DC
Start: 2023-01-15 — End: 2023-01-16
  Administered 2023-01-15 – 2023-01-16 (×3): 1000 mg via ORAL

## 2023-01-15 MED ORDER — CEFAZOLIN SODIUM-DEXTROSE 2-4 GM/100ML-% IV SOLN
2.0000 g | Freq: Four times a day (QID) | INTRAVENOUS | Status: AC
  Administered 2023-01-15 (×2): 2 g via INTRAVENOUS
  Filled 2023-01-15: qty 100

## 2023-01-15 MED ORDER — BUPIVACAINE HCL (PF) 0.5 % IJ SOLN
INTRAMUSCULAR | Status: AC
Start: 2023-01-15 — End: ?
  Filled 2023-01-15: qty 10

## 2023-01-15 MED ORDER — HYDROCHLOROTHIAZIDE 25 MG PO TABS
12.5000 mg | ORAL_TABLET | Freq: Every day | ORAL | Status: DC
  Administered 2023-01-15 – 2023-01-16 (×2): 12.5 mg via ORAL

## 2023-01-15 MED ORDER — SURGIFLO WITH THROMBIN (HEMOSTATIC MATRIX KIT) OPTIME
TOPICAL | Status: DC | PRN
  Administered 2023-01-15: 1 via TOPICAL

## 2023-01-15 MED ORDER — SODIUM CHLORIDE FLUSH 0.9 % IV SOLN
INTRAVENOUS | Status: AC
  Filled 2023-01-15: qty 50

## 2023-01-15 MED ORDER — GLYCOPYRROLATE 0.2 MG/ML IJ SOLN
INTRAMUSCULAR | Status: AC
  Filled 2023-01-15: qty 1

## 2023-01-15 MED ORDER — ACETAMINOPHEN 500 MG PO TABS
ORAL_TABLET | ORAL | Status: AC
  Filled 2023-01-15: qty 2

## 2023-01-15 MED ORDER — TRANEXAMIC ACID-NACL 1000-0.7 MG/100ML-% IV SOLN
INTRAVENOUS | Status: AC
  Filled 2023-01-15: qty 100

## 2023-01-15 MED ORDER — PHENYLEPHRINE HCL-NACL 20-0.9 MG/250ML-% IV SOLN
INTRAVENOUS | Status: AC
  Filled 2023-01-15: qty 250

## 2023-01-15 MED ORDER — ACETAMINOPHEN 325 MG PO TABS: 325.0000 mg | ORAL_TABLET | Freq: Four times a day (QID) | ORAL | Status: DC | PRN

## 2023-01-15 MED ORDER — PHENYLEPHRINE 80 MCG/ML (10ML) SYRINGE FOR IV PUSH (FOR BLOOD PRESSURE SUPPORT)
PREFILLED_SYRINGE | INTRAVENOUS | Status: DC | PRN
  Administered 2023-01-15 (×4): 160 ug via INTRAVENOUS

## 2023-01-15 MED ORDER — PROPOFOL 1000 MG/100ML IV EMUL
INTRAVENOUS | Status: AC
  Filled 2023-01-15: qty 100

## 2023-01-15 MED ORDER — SODIUM CHLORIDE 0.9 % IR SOLN
Status: DC | PRN
  Administered 2023-01-15: 100 mL

## 2023-01-15 MED ORDER — ENOXAPARIN SODIUM 40 MG/0.4ML IJ SOSY
40.0000 mg | PREFILLED_SYRINGE | INTRAMUSCULAR | Status: DC
  Administered 2023-01-16: 40 mg via SUBCUTANEOUS

## 2023-01-15 MED ORDER — ONDANSETRON HCL 4 MG/2ML IJ SOLN: 4.0000 mg | Freq: Four times a day (QID) | INTRAMUSCULAR | Status: DC | PRN

## 2023-01-15 MED ORDER — ACETAMINOPHEN 500 MG PO TABS
ORAL_TABLET | ORAL | Status: AC
Start: 2023-01-15 — End: ?
  Filled 2023-01-15: qty 2

## 2023-01-15 MED ORDER — ONDANSETRON HCL 4 MG PO TABS: 4.0000 mg | ORAL_TABLET | Freq: Four times a day (QID) | ORAL | Status: DC | PRN

## 2023-01-15 MED ORDER — ASPIRIN 81 MG PO TBEC
81.0000 mg | DELAYED_RELEASE_TABLET | Freq: Every day | ORAL | Status: DC
  Administered 2023-01-16: 81 mg via ORAL
  Filled 2023-01-15: qty 1

## 2023-01-15 MED ORDER — ONDANSETRON HCL 4 MG/2ML IJ SOLN
INTRAMUSCULAR | Status: AC
Start: 2023-01-15 — End: ?
  Filled 2023-01-15: qty 2

## 2023-01-15 MED ORDER — PHENOL 1.4 % MT LIQD: 1.0000 | OROMUCOSAL | Status: DC | PRN

## 2023-01-15 MED ORDER — METOCLOPRAMIDE HCL 5 MG/ML IJ SOLN: 5.0000 mg | Freq: Three times a day (TID) | INTRAMUSCULAR | Status: DC | PRN

## 2023-01-15 MED ORDER — MENTHOL 3 MG MT LOZG: 1.0000 | LOZENGE | OROMUCOSAL | Status: DC | PRN

## 2023-01-15 MED ORDER — 0.9 % SODIUM CHLORIDE (POUR BTL) OPTIME
TOPICAL | Status: DC | PRN
  Administered 2023-01-15: 500 mL

## 2023-01-15 SURGICAL SUPPLY — 65 items
BLADE CLIPPER SURG (BLADE) IMPLANT
BLADE SAGITTAL AGGR TOOTH XLG (BLADE) ×2 IMPLANT
BNDG COHESIVE 6X5 TAN ST LF (GAUZE/BANDAGES/DRESSINGS) ×4 IMPLANT
CHLORAPREP W/TINT 26 (MISCELLANEOUS) ×2 IMPLANT
DERMABOND ADVANCED .7 DNX12 (GAUZE/BANDAGES/DRESSINGS) ×2 IMPLANT
DRAPE C-ARM XRAY 36X54 (DRAPES) ×2 IMPLANT
DRAPE SHEET LG 3/4 BI-LAMINATE (DRAPES) ×6 IMPLANT
DRAPE TABLE BACK 80X90 (DRAPES) ×2 IMPLANT
DRSG MEPILEX SACRM 8.7X9.8 (GAUZE/BANDAGES/DRESSINGS) ×2 IMPLANT
DRSG OPSITE POSTOP 4X8 (GAUZE/BANDAGES/DRESSINGS) ×2 IMPLANT
ELECT BLADE 4.0 EZ CLEAN MEGAD (MISCELLANEOUS) ×1
ELECT REM PT RETURN 9FT ADLT (ELECTROSURGICAL) ×1
ELECTRODE BLDE 4.0 EZ CLN MEGD (MISCELLANEOUS) ×2 IMPLANT
ELECTRODE REM PT RTRN 9FT ADLT (ELECTROSURGICAL) ×2 IMPLANT
GLOVE BIO SURGEON STRL SZ8 (GLOVE) ×2 IMPLANT
GLOVE BIOGEL PI IND STRL 8 (GLOVE) ×2 IMPLANT
GLOVE PI ORTHO PRO STRL 7.5 (GLOVE) ×4 IMPLANT
GLOVE PI ORTHO PRO STRL SZ8 (GLOVE) ×4 IMPLANT
GLOVE SURG SYN 7.5 E (GLOVE) ×1 IMPLANT
GLOVE SURG SYN 7.5 PF PI (GLOVE) ×2 IMPLANT
GOWN SRG XL LVL 3 NONREINFORCE (GOWNS) ×2 IMPLANT
GOWN STRL REUS W/ TWL LRG LVL3 (GOWN DISPOSABLE) ×2 IMPLANT
GOWN STRL REUS W/ TWL XL LVL3 (GOWN DISPOSABLE) ×2 IMPLANT
HANDLE YANKAUER SUCT OPEN TIP (MISCELLANEOUS) ×2 IMPLANT
HEAD FEM TAPER UNIV 4 (Orthopedic Implant) IMPLANT
HOOD PEEL AWAY T7 (MISCELLANEOUS) ×4 IMPLANT
INSERT 0D POLY 40 SZ F (Insert) IMPLANT
IV NS 100ML SINGLE PACK (IV SOLUTION) ×2 IMPLANT
KIT PATIENT CARE HANA TABLE (KITS) ×2 IMPLANT
LIGHT WAVEGUIDE WIDE FLAT (MISCELLANEOUS) ×2 IMPLANT
MANIFOLD NEPTUNE II (INSTRUMENTS) ×2 IMPLANT
MARKER SKIN DUAL TIP RULER LAB (MISCELLANEOUS) ×2 IMPLANT
MAT ABSORB FLUID 56X50 GRAY (MISCELLANEOUS) ×2 IMPLANT
NDL FILTER BLUNT 18X1 1/2 (NEEDLE) IMPLANT
NDL SAFETY ECLIPSE 18X1.5 (NEEDLE) ×2 IMPLANT
NDL SPNL 20GX3.5 QUINCKE YW (NEEDLE) ×2 IMPLANT
NEEDLE FILTER BLUNT 18X1 1/2 (NEEDLE) IMPLANT
NEEDLE SPNL 20GX3.5 QUINCKE YW (NEEDLE) ×1 IMPLANT
NS IRRIG 500ML POUR BTL (IV SOLUTION) ×2 IMPLANT
PACK HIP COMPR (MISCELLANEOUS) ×2 IMPLANT
PAD ARMBOARD 7.5X6 YLW CONV (MISCELLANEOUS) ×2 IMPLANT
SCREW HEX LP 6.5X25 (Screw) IMPLANT
SCREW HEX LP 6.5X30 (Screw) IMPLANT
SHELL TRIDENT II CLUST SZ 56MM (Shell) IMPLANT
SLEEVE FEM ADAPTER V-40 +4MM (Sleeve) IMPLANT
SLEEVE SCD COMPRESS KNEE MED (STOCKING) ×2 IMPLANT
SOLUTION IRRIG SURGIPHOR (IV SOLUTION) ×2 IMPLANT
STEM HIGH OFFSET SZ6 38.5X107 (Stem) IMPLANT
SURGIFLO W/THROMBIN 8M KIT (HEMOSTASIS) IMPLANT
SUT BONE WAX W31G (SUTURE) ×2 IMPLANT
SUT ETHIBOND 2 V 37 (SUTURE) ×2 IMPLANT
SUT SILK 0 30XBRD TIE 6 (SUTURE) ×2 IMPLANT
SUT STRATA 1 CT-1 DLB (SUTURE) ×1
SUT STRATAFIX 14 PDO 48 VLT (SUTURE) ×2 IMPLANT
SUT STRATAFIX PDO 1 14 VIOLET (SUTURE) ×1
SUT VIC AB 0 CT1 36 (SUTURE) ×2 IMPLANT
SUT VIC AB 2-0 CT2 27 (SUTURE) ×4 IMPLANT
SUTURE STRATA SPIR 4-0 18 (SUTURE) ×2 IMPLANT
SYR 30ML LL (SYRINGE) ×4 IMPLANT
SYR TB 1ML LL NO SAFETY (SYRINGE) IMPLANT
TAPE MICROFOAM 4IN (TAPE) IMPLANT
TOWEL OR 17X26 4PK STRL BLUE (TOWEL DISPOSABLE) IMPLANT
TRAP FLUID SMOKE EVACUATOR (MISCELLANEOUS) ×2 IMPLANT
WAND WEREWOLF FASTSEAL 6.0 (MISCELLANEOUS) ×2 IMPLANT
WATER STERILE IRR 1000ML POUR (IV SOLUTION) ×2 IMPLANT

## 2023-01-15 NOTE — Plan of Care (Signed)
  Problem: Education: Goal: Knowledge of the prescribed therapeutic regimen will improve Outcome: Progressing   Problem: Activity: Goal: Ability to avoid complications of mobility impairment will improve Outcome: Progressing Goal: Ability to tolerate increased activity will improve Outcome: Progressing   Problem: Pain Management: Goal: Pain level will decrease with appropriate interventions Outcome: Progressing   

## 2023-01-15 NOTE — Interval H&P Note (Signed)
Patient history and physical updated. Consent reviewed including risks, benefits, and alternatives to surgery. Patient agrees with above plan to proceed with left anterior total hip arthroplasty  

## 2023-01-15 NOTE — Op Note (Signed)
Patient Name: Hardin Dar  MRN: [id number]  Pre-Operative Diagnosis: Left hip Osteoarthritis  Post-Operative Diagnosis: (same)  Procedure: Left Total Hip Arthroplasty  Components/Implants: Cup: Trident Tritanium Clusterhole 56/F w/ x2 screws   Liner: Neutral X3 poly 40/F  Stem: Insignia #6 high offset  Head:Biolox ceramic 40mm w/ +25mm adapter sleeve  Date of Surgery: 01/15/2023  Surgeon: Reinaldo Berber MD  Assistant: Amador Cunas PA (present and scrubbed throughout the case, critical for assistance with exposure, retraction, instrumentation, and closure)   Anesthesiologist: Lorette Ang  Anesthesia: Spinal   EBL: 350cc  IVF:800cc  Complications: None   Brief history: The patient is a 72 year old male with a history of osteoarthritis of the left hip with pain limiting their range of motion and activities of daily living, which has failed multiple attempts at conservative therapy.  The risks and benefits of total hip arthroplasty as definitive surgical treatment were discussed with the patient, who opted to proceed with the operation.  After outpatient medical clearance and optimization was completed the patient was admitted to [facility] for the procedure.  All preoperative films were reviewed and an appropriate surgical plan was made prior to surgery.   Description of procedure: The patient was brought to the operating room where laterality was confirmed by all those present to be the left side.  The patient was administered spinal anesthesia on a stretcher prior to being moved supine on the operating room table. Patient was given an intravenous dose of antibiotics for surgical prophylaxis and TXA.  All bony prominences and extremities were well padded and the patient was securely attached to the table boots, a perineal post was placed and the patient had a safety strap placed.  Surgical site was prepped with alcohol and chlorhexidine. The surgical site over the hip  was and draped in typical sterile fashion with multiple layers of adhesive and nonadhesive drapes.  The incision site was marked out with a sterile marker and care was taken to assess the position of the ASIS and ensure appropriate position for the incision.    A surgical timeout was then called with participation of all staff in the room the patient was then a confirmed again and laterality confirmed.  Incision was made over the anterior lateral aspect of the proximal thigh in line with the TFL.  Appropriate retractors were placed and all bleeding vessels were coagulated within the subcutaneous and fatty layers.  An incision was made in the TFL fascia in the interval was carefully identified.  The lateral ascending branches of the circumflex vessels were identified, cauterized and carefully dissected.  Retractors were placed around the superior lateral and inferior medial aspects of the femoral neck and a capsulotomy was performed exposing the hip joint.  Retraction stitches were placed and the capsulotomy to assist with visualization.  Femoral neck cut was then made and the femoral head was extracted after placing the leg in traction.  Bone wax was then applied to the proximal cut surface of the femur and aqua mantis was used to address any bleeding around the femoral neck cut.  Retractors were then placed around the acetabulum to fully visualize the joint space, and the remaining labral tissue was removed and pulvinar was removed.   The acetabulum was then sequentially reamed up to the appropriate size in order to get good fit and fill for the acetabular component while under fluoroscopic guidance.  Acetabular component was then placed and malleted into a secure fit while confirming position and abduction angle  and anteversion utilizing fluoroscopy.  2 screws were then placed in the acetabular cup to assist in securing the cup in place. The cup was irrigated,  a real neutral liner was placed, impacted, and  checked for stability. The femur traction was dropped and sequentially externally rotated while performing a release of the posterior and superomedial tissues off of the proximal femur to allow for mobility, care was taken to preserve the external rotators and piriformis attachments.  The remaining interval between the abductors and the capsule was dissected out and a retractor was placed over the superolateral aspect of the femur over the greater trochanter.  The leg was carefully brought down into extension and adducted to provide visualization of the proximal femur for broaching.  The femur was then sequentially broached up to an appropriate size which provided for good fill and stability to the femoral broach.  A trial neck and head were placed on the femoral broach and the leg was brought up for reduction.  The hip was reduced and manual check of stability was performed.  The hip was found to be stable in flexion internal rotation and extension external rotation.  Leg lengths were confirmed on fluoroscopy.   The hip was then dislocated the trial neck and head were removed.  The leg was then brought down into extension and adduction in the proximal femur was reexposed.  The broach trial was removed and the femur was irrigated with normal saline prior to the real femoral stem being implanted.  After the femoral stem was seated and shown to have good fit and fill the appropriate head was impacted the leg was brought up and reduced.  There was good range of motion with stability in flexion internal rotation and extension external rotation on testing.  Leg lengths were found to be appropriate on fluoroscopic evaluation at this time.  The hip was then irrigated with betdine based surgiphor solution and then saline solution.  The capsulotomy was repaired with Ethibond sutures.  A pericapsular and peritrochanteric cocktail with Exparel and bupivacaine was then injected as well as the subcutaneous tissues. Surgiflo  was applied to the cut edges of the bone to help reduce any postoperative bleeding. The fascia was closed with a #1 barbed running suture.  The deep tissues were closed with Vicryl sutures the subcutaneous tissues were closed with interrupted Vicryl sutures and a running barbed 4-0 suture.  The skin was then reinforced with Dermabond and a sterile dressing was placed.   The patient was awoken from anesthesia transferred off of the operating room table onto a hospital bed where examination of leg lengths found the leg lengths to be equal with a good distal pulse.  The patient was then transferred to the PACU in stable condition.

## 2023-01-15 NOTE — Discharge Instructions (Addendum)
Instructions after Anterior Total Hip Replacement        Dr. Regenia Skeeter., M.D.      Dept. of Orthopaedics & Sports Medicine  [facility]  [address]  [place], [place]  [address]  Phone: [phone]   Fax: [phone]    DIET: Drink plenty of non-alcoholic fluids. Resume your normal diet. Include foods high in fiber.  ACTIVITY:  You may use crutches or a walker with weight-bearing as tolerated, unless instructed otherwise. You may be weaned off of the walker or crutches by your Physical Therapist.  Continue doing gentle exercises. Exercising will reduce the pain and swelling, increase motion, and prevent muscle weakness.   Please continue to use the TED compression stockings for 2 weeks. You may remove the stockings at night, but should reapply them in the morning. Do not drive or operate any equipment until instructed.  WOUND CARE:  Continue to use ice packs periodically to reduce pain and swelling. You may shower with honeycomb dressing 3 days after your surgery. Do not submerge incision site under water. Remove honeycomb dressing 7 days after surgery and allow dermabond to fall off on its own.   MEDICATIONS: You may resume your regular medications. Please take the pain medication as prescribed on the medication list. Do not take pain medication on an empty stomach. You have been given a prescription for a blood thinner to prevent blood clots. Please take the medication as instructed. (NOTE: After completing a 2 week course of Lovenox, take one Enteric-coated 81 mg aspirin twice a day for 3 additional weeks.) Pain medications and iron supplements can cause constipation. Use a stool softener (Senokot or Colace) on a daily basis and a laxative (dulcolax or miralax) as needed. Do not drive or drink alcoholic beverages when taking pain medications.  POSTOPERATIVE CONSTIPATION PROTOCOL Constipation - defined medically as fewer than three stools per week and  severe constipation as less than one stool per week.  One of the most common issues patients have following surgery is constipation.  Even if you have a regular bowel pattern at home, your normal regimen is likely to be disrupted due to multiple reasons following surgery.  Combination of anesthesia, postoperative narcotics, change in appetite and fluid intake all can affect your bowels.  In order to avoid complications following surgery, here are some recommendations in order to help you during your recovery period.  Colace (docusate) - Pick up an over-the-counter form of Colace or another stool softener and take twice a day as long as you are requiring postoperative pain medications.  Take with a full glass of water daily.  If you experience loose stools or diarrhea, hold the colace until you stool forms back up.  If your symptoms do not get better within 1 week or if they get worse, check with your doctor.  Dulcolax (bisacodyl) - Pick up over-the-counter and take as directed by the product packaging as needed to assist with the movement of your bowels.  Take with a full glass of water.  Use this product as needed if not relieved by Colace only.   MiraLax (polyethylene glycol) - Pick up over-the-counter to have on hand.  MiraLax is a solution that will increase the amount of water in your bowels to assist with bowel movements.  Take as directed and can mix with a glass of water, juice, soda, coffee, or tea.  Take if you go more than two days without a movement. Do not use MiraLax more than  once per day. Call your doctor if you are still constipated or irregular after using this medication for 7 days in a row.  If you continue to have problems with postoperative constipation, please contact the office for further assistance and recommendations.  If you experience "the worst abdominal pain ever" or develop nausea or vomiting, please contact the office immediatly for further recommendations for  treatment.   CALL THE OFFICE FOR: Temperature above 101 degrees Excessive bleeding or drainage on the dressing. Excessive swelling, coldness, or paleness of the toes. Persistent nausea and vomiting.  FOLLOW-UP:  You should have an appointment to return to the office in 2 weeks after surgery. Arrangements have been made for continuation of Physical Therapy (either home therapy or outpatient therapy).

## 2023-01-15 NOTE — Evaluation (Signed)
Physical Therapy Evaluation Patient Details Name: Stephen Schmidt MRN: [id number] DOB: [birth year] Today's Date: 01/15/2023  History of Present Illness  Pt is a 72 yo male s/p L THA. PMH of HTN.  Clinical Impression  The pt was A&Ox4, reported no pain initially but with ambulation did describe L lateral thigh burning but did not quantify. Stated he uses a quad cane and is modI. He was able to perform several therapeutic exercises with verbal cues. He was able to transfer to EOB with CGA, ambulated with RW and CGA ~56ft. Did exhibit decreased gait velocity, step length/height especially LLE. He was also able to statically stand and void. Returned to room with needs in reach.  Overall the patient demonstrated deficits (see "PT Problem List") that impede the patient's functional abilities, safety, and mobility and would benefit from skilled PT intervention.          If plan is discharge home, recommend the following: A little help with bathing/dressing/bathroom;Assistance with cooking/housework;Assist for transportation;Help with stairs or ramp for entrance   Can travel by private vehicle        Equipment Recommendations Rolling walker (2 wheels);BSC/3in1  Recommendations for Other Services       Functional Status Assessment Patient has had a recent decline in their functional status and demonstrates the ability to make significant improvements in function in a reasonable and predictable amount of time.     Precautions / Restrictions Precautions Precautions: Fall;Anterior Hip Precaution Booklet Issued: Yes (comment) Restrictions Weight Bearing Restrictions: Yes LLE Weight Bearing: Weight bearing as tolerated      Mobility  Bed Mobility Overal bed mobility: Needs Assistance Bed Mobility: Supine to Sit     Supine to sit: Supervision, Used rails, HOB elevated          Transfers Overall transfer level: Needs assistance Equipment used: Rolling walker (2 wheels) Transfers: Sit  to/from Stand Sit to Stand: Contact guard assist                Ambulation/Gait Ambulation/Gait assistance: Contact guard assist Gait Distance (Feet): 70 Feet Assistive device: Rolling walker (2 wheels) Gait Pattern/deviations: Step-to pattern Gait velocity: decreased        Stairs            Wheelchair Mobility     Tilt Bed    Modified Rankin (Stroke Patients Only)       Balance Overall balance assessment: Needs assistance Sitting-balance support: Feet supported Sitting balance-Leahy Scale: Good       Standing balance-Leahy Scale: Fair Standing balance comment: able to stand and void                             Pertinent Vitals/Pain Pain Assessment Pain Assessment: Faces Faces Pain Scale: Hurts a little bit Pain Location: mentions L lateral thigh burning but did not quantify pain Pain Descriptors / Indicators: Burning Pain Intervention(s): Limited activity within patient's tolerance, Monitored during session, Repositioned, Premedicated before session    Home Living Family/patient expects to be discharged to:: Private residence (going to stay at his son's house)   Available Help at Discharge: Family Type of Home: House Home Access: Level entry;Ramped entrance       Home Layout: One level Home Equipment: Cane - quad      Prior Function Prior Level of Function : Independent/Modified Independent  Extremity/Trunk Assessment   Upper Extremity Assessment Upper Extremity Assessment: Overall WFL for tasks assessed    Lower Extremity Assessment Lower Extremity Assessment: Overall WFL for tasks assessed (s/p L THA)       Communication      Cognition Arousal: Alert Behavior During Therapy: WFL for tasks assessed/performed Overall Cognitive Status: Within Functional Limits for tasks assessed                                          General Comments      Exercises      Assessment/Plan    PT Assessment Patient needs continued PT services  PT Problem List Decreased strength;Pain;Decreased range of motion;Decreased activity tolerance;Decreased knowledge of use of DME;Decreased balance;Decreased knowledge of precautions;Decreased mobility       PT Treatment Interventions DME instruction;Neuromuscular re-education;Stair training;Patient/family education;Functional mobility training;Therapeutic activities;Therapeutic exercise;Balance training    PT Goals (Current goals can be found in the Care Plan section)  Acute Rehab PT Goals Patient Stated Goal: to go home PT Goal Formulation: With patient Time For Goal Achievement: 01/29/23 Potential to Achieve Goals: Good    Frequency BID     Co-evaluation               AM-PAC PT "6 Clicks" Mobility  Outcome Measure Help needed turning from your back to your side while in a flat bed without using bedrails?: A Little Help needed moving from lying on your back to sitting on the side of a flat bed without using bedrails?: A Little Help needed moving to and from a bed to a chair (including a wheelchair)?: A Little Help needed standing up from a chair using your arms (e.g., wheelchair or bedside chair)?: A Little Help needed to walk in hospital room?: A Little Help needed climbing 3-5 steps with a railing? : A Little 6 Click Score: 18    End of Session Equipment Utilized During Treatment: Gait belt Activity Tolerance: Patient tolerated treatment well Patient left: in chair;with call bell/phone within reach Nurse Communication: Mobility status PT Visit Diagnosis: Other abnormalities of gait and mobility (R26.89);Difficulty in walking, not elsewhere classified (R26.2);Muscle weakness (generalized) (M62.81);Pain Pain - Right/Left: Left Pain - part of body: Hip    Time: 8413-2440 PT Time Calculation (min) (ACUTE ONLY): 27 min   Charges:   PT Evaluation $PT Eval Low Complexity: 1 Low PT  Treatments $Therapeutic Activity: 23-37 mins PT General Charges $$ ACUTE PT VISIT: 1 Visit         Olga Coaster PT, DPT 3:59 PM,01/15/23

## 2023-01-15 NOTE — Transfer of Care (Signed)
Immediate Anesthesia Transfer of Care Note  Patient: [name]  Procedure(s) Performed: TOTAL HIP ARTHROPLASTY ANTERIOR APPROACH (Left: Hip)  Patient Location: PACU  Anesthesia Type:Spinal  Level of Consciousness: awake  Airway & Oxygen Therapy: Patient Spontanous Breathing  Post-op Assessment: Report given to RN and Post -op Vital signs reviewed and stable  Post vital signs: Reviewed  Last Vitals:  Vitals Value Taken Time  BP 101/65 01/15/23 1007  Temp    Pulse 75 01/15/23 1009  Resp 15 01/15/23 1009  SpO2 99 % 01/15/23 1009  Vitals shown include unfiled device data.  Last Pain:  Vitals:   01/15/23 0628  TempSrc: Tympanic  PainSc: 0-No pain         Complications: No notable events documented.

## 2023-01-15 NOTE — Anesthesia Procedure Notes (Signed)
Spinal  Patient location during procedure: OR Start time: 01/15/2023 7:35 AM End time: 01/15/2023 7:45 AM Reason for block: surgical anesthesia Staffing Performed: anesthesiologist  Anesthesiologist: Stephanie Coup, MD Performed by: Stephanie Coup, MD Authorized by: Stephanie Coup, MD   Preanesthetic Checklist Completed: patient identified, IV checked, site marked, risks and benefits discussed, surgical consent, monitors and equipment checked, pre-op evaluation and timeout performed Spinal Block Patient position: sitting Prep: Betadine Patient monitoring: heart rate, continuous pulse ox, blood pressure and cardiac monitor Approach: midline Location: L3-4 Injection technique: single-shot Needle Needle type: Whitacre and Introducer  Needle gauge: 24 G Needle length: 9 cm Assessment Sensory level: T4 Events: CSF return

## 2023-01-15 NOTE — Anesthesia Preprocedure Evaluation (Signed)
Anesthesia Evaluation  Patient identified by MRN, date of birth, ID band Patient awake    Reviewed: Allergy & Precautions, H&P , NPO status , Patient's Chart, lab work & pertinent test results, reviewed documented beta blocker date and time   History of Anesthesia Complications Negative for: history of anesthetic complications  Airway Mallampati: I  TM Distance: >3 FB Neck ROM: full    Dental  (+) Dental Advidsory Given, Teeth Intact   Pulmonary neg pulmonary ROS          Cardiovascular Exercise Tolerance: Good hypertension, (-) angina (-) CAD, (-) Past MI, (-) Cardiac Stents and (-) CABG (-) dysrhythmias (-) Valvular Problems/Murmurs     Neuro/Psych negative neurological ROS  negative psych ROS   GI/Hepatic negative GI ROS, Neg liver ROS,,,  Endo/Other  negative endocrine ROS    Renal/GU Renal disease  negative genitourinary   Musculoskeletal   Abdominal   Peds  Hematology negative hematology ROS (+)   Anesthesia Other Findings Past Medical History: No date: Hyperlipidemia No date: Hypertension No date: Obesity, Class I, BMI 30.0-34.9 (see actual BMI)     Comment:  However notably lighter used to be.  Once 324 pounds in               1996   Reproductive/Obstetrics negative OB ROS                             Anesthesia Physical Anesthesia Plan  ASA: 2  Anesthesia Plan: General and Spinal   Post-op Pain Management:  Regional for Post-op pain   Induction: Intravenous  PONV Risk Score and Plan: 2 and Ondansetron, Midazolam and Dexamethasone  Airway Management Planned: Natural Airway and Nasal Cannula  Additional Equipment:   Intra-op Plan:   Post-operative Plan: Extubation in OR  Informed Consent: I have reviewed the patients History and Physical, chart, labs and discussed the procedure including the risks, benefits and alternatives for the proposed anesthesia with the  patient or authorized representative who has indicated his/her understanding and acceptance.     Dental Advisory Given  Plan Discussed with: Anesthesiologist, CRNA and Surgeon  Anesthesia Plan Comments: (Patient reports no bleeding problems and no anticoagulant use.  Plan for spinal with backup GA  Patient consented for risks of anesthesia including but not limited to:  - adverse reactions to medications - damage to eyes, teeth, lips or other oral mucosa - nerve damage due to positioning  - risk of bleeding, infection and or nerve damage from spinal that could lead to paralysis - risk of headache or failed spinal - damage to teeth, lips or other oral mucosa - sore throat or hoarseness - damage to heart, brain, nerves, lungs, other parts of body or loss of life  Patient voiced understanding and assent.)        Anesthesia Quick Evaluation

## 2023-01-15 NOTE — H&P (Signed)
History of Present Illness: Stephen Schmidt is an 72 y.o. male presents for relation of his left hip. The patient reports he has had ongoing pain which initially was intermittent has become more constant over time in his left groin. He describes it as a throbbing dull aching pain which does not radiate. It has been slightly improved with Celebrex use but despite treatment still gets up to a 10 out of 10 with daily activities. He is also done home exercises and continues to cycle as much as he can. Last summer he was previously going to spend 6 days a week. At this time he reports that his hip is limiting him on a daily basis preventing him from participating activities of daily living and exercising. He is no longer interested in conservative measures including trying injections and would like a more permanent solution for his hip problem. The patient denies fevers, chills, numbness, tingling, shortness of breath, chest pain, recent illness, or any trauma.  Patient is a non-smoker, nondiabetic with a BMI of 28.7  Past Medical History: Past Medical History:  Diagnosis Date  Hypertension  Pure hypercholesterolemia   Past Surgical History: Past Surgical History:  Procedure Laterality Date  SHOULDER ARTHROSCOPY DISTAL CLAVICLE EXCISION AND OPEN ROTATOR CUFF REPAIR Right 02/15/2018  MINI-OPEN ROTATOR CUFF REPAIR (SUPRASPINATUS AND INFRASPINATUS) OPEN BICEPS TENODESIS, RIGHT ARTHROSCOPIC EXTENSIVE DEBRIDMENT OF SHOULDER (GLENOHUMERAL AND SUBACROMIAL SPACES) SUBACROMIAL DECOMPRESSION- Dr. Allena Katz  TONSILLECTOMY   Past Family History: Family History  Problem Relation Age of Onset  Heart failure Mother  Kidney disease Sister   Medications: Current Outpatient Medications  Medication Sig Dispense Refill  acetaminophen (TYLENOL) 500 MG tablet Take 2 tablets (1,000 mg total) by mouth every 8 (eight) hours  apple cider vinegar 600 mg Cap Take by mouth once daily  ascorbic acid, vitamin C, 500 mg  Chew Take 1 tablet by mouth once daily  aspirin 81 MG EC tablet Take 81 mg by mouth once daily.  benazepriL (LOTENSIN) 20 MG tablet Take 1 tablet by mouth once daily 90 tablet 1  celecoxib (CELEBREX) 200 MG capsule Take 1 capsule (200 mg total) by mouth 2 (two) times daily 60 capsule 3  cyanocobalamin (VITAMIN B12) 500 MCG tablet Take 500 mcg by mouth once daily  Herbal Supplement Turmeric with BioPerine 650mg - 1 tablet PO QD  Herbal Supplement Mega Vita-Min- 1 tablet PO QD  Herbal Supplement [place] Cellular Immunity supplement 1000mg - 1 tablet PO QD  hydroCHLOROthiazide (HYDRODIURIL) 12.5 MG tablet Take 1 tablet by mouth once daily 90 tablet 1  omega-3 fatty acids-fish oil 360-1,200 mg Cap Take 1 capsule by mouth 2 (two) times daily.  pravastatin (PRAVACHOL) 20 MG tablet Take 1 tablet by mouth once daily 90 tablet 1  turmeric root extract 500 mg Cap Take by mouth once daily   No current facility-administered medications for this visit.   Allergies: No Known Allergies   Visit Vitals: Vitals:  12/17/22 1136  BP: 130/82    Review of Systems:  A comprehensive 14 point ROS was performed, reviewed, and the pertinent orthopaedic findings are documented in the HPI.  Physical Exam: Body mass index is 28.7 kg/m. General/Constitutional: No apparent distress: well-nourished and well developed. Lymphatic: No palpable adenopathy. Pulmonary exam: Lungs clear to auscultation bilaterally no wheezing rales or rhonchi Cardiac exam: Regular rate and rhythm no obvious murmurs rubs or gallops. Vascular: No edema, swelling or tenderness, except as noted in detailed exam. Integumentary: No impressive skin lesions present, except as noted in detailed  exam. Neuro/Psych: Normal mood and affect, oriented to person, place and time. Musculoskeletal: Normal, except as noted in detailed exam and in HPI.  Left hip exam  SKIN: intact SWELLING: none WARMTH: no warmth TENDERNESS: none, Stinchfield  Positive ROM: 0 degrees internal rotation and 30 degrees external rotation and pain with internal rotation and external rotation localized to the groin,; Hip Flexion 75 STRENGTH: normal GAIT: antalgic with a cane STABILITY: stable to testing CREPITUS: yes LEG LENGTH DISCREPANCY: none NEUROLOGICAL EXAM: normal VASCULAR EXAM: normal LUMBAR SPINE: tenderness: no straight leg raising sign: no motor exam: normal  The contralateral hip was examined for comparison and it showed: TENDERNESS: none ROM: Internal rotation to 0 degrees external rotation to 30 degrees hip flexion to 90 degrees pain with internal rotation and hip flexion localized to the groin STRENGTH: normal STABILITY: stable to testing  Hip Imaging :  I reviewed AP pelvis and lateral left hip x-rays performed today images reviewed by myself. The patient has severe left hip degenerative changes with joint space narrowing with bone-on-bone articulation osteophyte formation and sclerosis and femoral head deformity on the left. On the right there is similar appearance with less cystic changes but joint space narrowing sclerosis osteophytes and femoral head deformity. No fractures or dislocations noted.  Assessment:  Encounter Diagnosis  Name Primary?  Primary osteoarthritis of left hip Yes   Right hip osteoarthritis  Plan: Stephen Schmidt is a 72 year old male who presents with bilateral hip bone on bone arthritis more symptomatic on the left. Based upon the patient's continued symptoms and failure to respond to conservative treatment, I have recommended a left total hip replacement for this patient. A long discussion took place with the patient describing what a total joint replacement is and what the procedure would entail. A hip model, similar to the implants that will be used during the operation, was utilized to demonstrate the implants. Choices of implant manufactures were discussed and reviewed. The ability to secure the implant  utilizing cement or cementless (press fit) fixation was discussed. Anterior and posterior exposures were discussed. For this patient an appropriate approach will be anterior.   The hospitalization and post-operative care and rehabilitation were also discussed. The use of perioperative antibiotics and DVT prophylaxis were discussed. The risk, benefits and alternatives to a surgical intervention were discussed at length with the patient. The patient was also advised of risks related to the medical comorbidities and elevated body mass index (BMI). A lengthy discussion took place to review the most common complications including but not limited to: deep vein thrombosis, pulmonary embolus, heart attack, stroke, infection, wound breakdown, heterotopic ossification, dislocation, numbness, leg length in-equality, intraoperative fracture, damage to nerves, tendon,muscles, arteries or other blood vessels, death and other possible complications from anesthesia. The patient was told that we will take steps to minimize these risks by using sterile technique, antibiotics and DVT prophylaxis when appropriate and follow the patient postoperatively in the office setting to monitor progress. The possibility of recurrent pain, no improvement in pain and actual worsening of pain were also discussed with the patient. The risk of dislocation following total hip replacement was discussed and potential precautions to prevent dislocation were reviewed.   The discharge plan of care focused on the patient going home following surgery. The patient was encouraged to make the necessary arrangements to have someone stay with them when they are discharged home.   The benefits of surgery were discussed with the patient including the potential for improving the patient's current clinical condition  through operative intervention. Alternatives to surgical intervention including continued conservative management were also discussed in detail. All  questions were answered to the satisfaction of the patient. The patient participated and agreed to the plan of care as well as the use of the recommended implants for their total hip replacement surgery. An information packet was given to the patient to review prior to surgery.   Patient received medical clearance for the surgery. All questions answered and patient agrees above plan preparations for a left anterior total replacement.  Portions of this record have been created using Scientist, clinical (histocompatibility and immunogenetics). Dictation errors have been sought, but may not have been identified and corrected.  Reinaldo Berber MD

## 2023-01-16 ENCOUNTER — Encounter: Payer: Self-pay | Admitting: Orthopedic Surgery

## 2023-01-16 DIAGNOSIS — M1612 Unilateral primary osteoarthritis, left hip: Secondary | ICD-10-CM | POA: Diagnosis not present

## 2023-01-16 DIAGNOSIS — N529 Male erectile dysfunction, unspecified: Secondary | ICD-10-CM | POA: Diagnosis not present

## 2023-01-16 DIAGNOSIS — R600 Localized edema: Secondary | ICD-10-CM | POA: Diagnosis not present

## 2023-01-16 DIAGNOSIS — N318 Other neuromuscular dysfunction of bladder: Secondary | ICD-10-CM | POA: Diagnosis not present

## 2023-01-16 DIAGNOSIS — Z96642 Presence of left artificial hip joint: Secondary | ICD-10-CM | POA: Diagnosis not present

## 2023-01-16 DIAGNOSIS — G4762 Sleep related leg cramps: Secondary | ICD-10-CM | POA: Diagnosis not present

## 2023-01-16 DIAGNOSIS — D72819 Decreased white blood cell count, unspecified: Secondary | ICD-10-CM | POA: Diagnosis not present

## 2023-01-16 LAB — BASIC METABOLIC PANEL
Anion gap: 7 (ref 5–15)
BUN: 29 mg/dL — ABNORMAL HIGH (ref 8–23)
CO2: 26 mmol/L (ref 22–32)
Calcium: 8.6 mg/dL — ABNORMAL LOW (ref 8.9–10.3)
Chloride: 106 mmol/L (ref 98–111)
Creatinine, Ser: 1.53 mg/dL — ABNORMAL HIGH (ref 0.61–1.24)
GFR, Estimated: 49 mL/min — ABNORMAL LOW (ref >60)
Glucose, Bld: 128 mg/dL — ABNORMAL HIGH (ref 70–99)
Potassium: 4.4 mmol/L (ref 3.5–5.1)
Sodium: 139 mmol/L (ref 135–145)

## 2023-01-16 LAB — CBC
HCT: 32.5 % — ABNORMAL LOW (ref 39.0–52.0)
Hemoglobin: 11.3 g/dL — ABNORMAL LOW (ref 13.0–17.0)
MCH: 28 pg (ref 26.0–34.0)
MCHC: 34.8 g/dL (ref 30.0–36.0)
MCV: 80.6 fL (ref 80.0–100.0)
Platelets: 128 10*3/uL — ABNORMAL LOW (ref 150–400)
RBC: 4.03 MIL/uL — ABNORMAL LOW (ref 4.22–5.81)
RDW: 13.9 % (ref 11.5–15.5)
WBC: 9.1 10*3/uL (ref 4.0–10.5)
nRBC: 0 % (ref 0.0–0.2)

## 2023-01-16 MED ORDER — DOCUSATE SODIUM 100 MG PO CAPS: 100.0000 mg | ORAL_CAPSULE | Freq: Two times a day (BID) | ORAL | 0 refills | Status: DC

## 2023-01-16 MED ORDER — HYDROCHLOROTHIAZIDE 25 MG PO TABS
ORAL_TABLET | ORAL | Status: AC
  Filled 2023-01-16: qty 1

## 2023-01-16 MED ORDER — KETOROLAC TROMETHAMINE 15 MG/ML IJ SOLN
INTRAMUSCULAR | Status: AC
  Filled 2023-01-16: qty 1

## 2023-01-16 MED ORDER — ASPIRIN 81 MG PO CHEW
CHEWABLE_TABLET | ORAL | Status: AC
  Filled 2023-01-16: qty 1

## 2023-01-16 MED ORDER — ENOXAPARIN SODIUM 40 MG/0.4ML IJ SOSY
PREFILLED_SYRINGE | INTRAMUSCULAR | Status: AC
Start: 2023-01-16 — End: ?
  Filled 2023-01-16: qty 0.4

## 2023-01-16 MED ORDER — ENOXAPARIN SODIUM 40 MG/0.4ML IJ SOSY: 40.0000 mg | PREFILLED_SYRINGE | INTRAMUSCULAR | 0 refills | Status: DC

## 2023-01-16 MED ORDER — DOCUSATE SODIUM 100 MG PO CAPS
ORAL_CAPSULE | ORAL | Status: AC
  Filled 2023-01-16: qty 1

## 2023-01-16 MED ORDER — ONDANSETRON HCL 4 MG PO TABS: 4.0000 mg | ORAL_TABLET | Freq: Four times a day (QID) | ORAL | 0 refills | Status: DC | PRN

## 2023-01-16 MED ORDER — TRAMADOL HCL 50 MG PO TABS: 50.0000 mg | ORAL_TABLET | Freq: Four times a day (QID) | ORAL | 0 refills | Status: DC | PRN

## 2023-01-16 MED ORDER — PANTOPRAZOLE SODIUM 40 MG PO TBEC
DELAYED_RELEASE_TABLET | ORAL | Status: AC
  Filled 2023-01-16: qty 1

## 2023-01-16 MED ORDER — ACETAMINOPHEN 500 MG PO TABS: 1000.0000 mg | ORAL_TABLET | Freq: Three times a day (TID) | ORAL | 0 refills | Status: DC

## 2023-01-16 MED ORDER — ACETAMINOPHEN 500 MG PO TABS
ORAL_TABLET | ORAL | Status: AC
  Filled 2023-01-16: qty 2

## 2023-01-16 MED ORDER — PRAVASTATIN SODIUM 40 MG PO TABS
ORAL_TABLET | ORAL | Status: AC
  Filled 2023-01-16: qty 1

## 2023-01-16 NOTE — Discharge Summary (Signed)
Physician Discharge Summary  Patient ID: Stephen Schmidt MRN: [id number] DOB/AGE: [birth year] 72 y.o.  Admit date: 01/15/2023 Discharge date: 01/16/2023  Admission Diagnoses:  S/P total left hip arthroplasty [X52.841]   Discharge Diagnoses: Patient Active Problem List   Diagnosis Date Noted   S/P total left hip arthroplasty 01/15/2023   Leukopenia 04/18/2022   Thrombocytopenia (HCC) 04/18/2022   Stage 3a chronic kidney disease (HCC) 04/18/2022   Obesity (BMI 30-39.9) 10/28/2012    Class: Diagnosis of   Essential hypertension 10/28/2012   Edema of both legs - with heaviness, swelling and cramping 10/28/2012   Hyperlipidemia    Impotence of organic origin 10/20/2012   Hypertonicity of bladder 10/20/2012   Sleep related leg cramps 07/02/2012    Past Medical History:  Diagnosis Date   Arthritis    Chronic kidney disease    Stage 3   Edema of both legs    with heaviness, swelling, and cramping   Hyperlipidemia    Hypertension    Hypertonicity of bladder    Leukopenia    Obesity, Class I, BMI 30.0-34.9 (see actual BMI)    However notably lighter used to be.  Once 324 pounds in 1996   Thrombocytopenia (HCC)      Transfusion: none   Consultants (if any):   Discharged Condition: Improved  Hospital Course: Stephen Schmidt is an 72 y.o. male who was admitted 01/15/2023 with a diagnosis of S/P total left hip arthroplasty and went to the operating room on 01/15/2023 and underwent the above named procedures.    Surgeries: Procedure(s): TOTAL HIP ARTHROPLASTY ANTERIOR APPROACH on 01/15/2023 Patient tolerated the surgery well. Taken to PACU where she was stabilized and then transferred to the orthopedic floor.  Started on Lovenox 40 mg q 24 hrs. TEDs and SCDs applied bilaterally. Heels elevated on bed. No evidence of DVT. Negative Homan. Physical therapy started on day #1 for gait training and transfer. OT started day #1 for ADL and assisted devices.  Patient's IV was d/c on day  #1. Patient was able to safely and independently complete all PT goals. PT recommending discharge to home.    On post op day #1 patient was stable and ready for discharge to home with HHPT.  Implants: Cup: Trident Tritanium Clusterhole 56/F w/ x2 screws   Liner: Neutral X3 poly 40/F  Stem: Insignia #6 high offset  Head:Biolox ceramic 40mm w/ +74mm adapter sleeve   He was given perioperative antibiotics:  Anti-infectives (From admission, onward)    Start     Dose/Rate Route Frequency Ordered Stop   01/15/23 1500  ceFAZolin (ANCEF) IVPB 2g/100 mL premix        2 g 200 mL/hr over 30 Minutes Intravenous Every 6 hours 01/15/23 1430 01/15/23 2154   01/15/23 0600  ceFAZolin (ANCEF) IVPB 2g/100 mL premix        2 g 200 mL/hr over 30 Minutes Intravenous On call to O.R. 01/14/23 2149 01/15/23 3244     .  He was given sequential compression devices, early ambulation, and Lovenox TEDs for DVT prophylaxis.  He benefited maximally from the hospital stay and there were no complications.    Recent vital signs:  Vitals:   01/16/23 0005 01/16/23 0600  BP: 138/68 (!) 113/52  Pulse: 90 92  Resp: 17 16  Temp: 99.4 F (37.4 C) 98.8 F (37.1 C)  SpO2: 96% 96%    Recent laboratory studies:  Lab Results  Component Value Date   HGB 11.3 (L) 01/16/2023  HGB 13.2 01/07/2023   HGB 13.6 04/18/2022   Lab Results  Component Value Date   WBC 9.1 01/16/2023   PLT 128 (L) 01/16/2023   No results found for: "INR" Lab Results  Component Value Date   NA 139 01/16/2023   K 4.4 01/16/2023   CL 106 01/16/2023   CO2 26 01/16/2023   BUN 29 (H) 01/16/2023   CREATININE 1.53 (H) 01/16/2023   GLUCOSE 128 (H) 01/16/2023    Discharge Medications:   Allergies as of 01/16/2023   No Known Allergies      Medication List     STOP taking these medications    celecoxib 200 MG capsule Commonly known as: CELEBREX       TAKE these medications    acetaminophen 500 MG tablet Commonly known as:  TYLENOL Take 2 tablets (1,000 mg total) by mouth every 8 (eight) hours. What changed:  how much to take when to take this reasons to take this   Apple Cider Vinegar 600 MG Caps Take 600 mg by mouth daily.   aspirin EC 81 MG tablet Take 81 mg by mouth daily.   benazepril 40 MG tablet Commonly known as: LOTENSIN Take 40 mg by mouth daily.   Calcium Gummies 250-100-500 MG-UNIT Chew Generic drug: Calcium-Phosphorus-Vitamin D Chew 1 tablet by mouth daily.   Cinnamon 500 MG Tabs Take 500 mg by mouth daily.   COQ10 GUMMIES ADULT PO Take 2 each by mouth daily.   cyanocobalamin 500 MCG tablet Commonly known as: VITAMIN B12 Take 500 mcg by mouth daily.   docusate sodium 100 MG capsule Commonly known as: COLACE Take 1 capsule (100 mg total) by mouth 2 (two) times daily.   enoxaparin 40 MG/0.4ML injection Commonly known as: LOVENOX Inject 0.4 mLs (40 mg total) into the skin daily for 14 days.   FISH OIL ADULT GUMMIES PO Take 2 tablets by mouth daily.   hydrochlorothiazide 12.5 MG tablet Commonly known as: HYDRODIURIL Take 12.5 mg by mouth daily.   MAGNESIUM PO Take 2 tablets by mouth daily.   Multivitamin Adult Chew Chew 1 each by mouth daily.   ondansetron 4 MG tablet Commonly known as: ZOFRAN Take 1 tablet (4 mg total) by mouth every 6 (six) hours as needed for nausea.   OVER THE COUNTER MEDICATION Take 1 capsule by mouth daily. Chaga Mushroom Supplement   pravastatin 20 MG tablet Commonly known as: PRAVACHOL Take 20 mg by mouth daily.   traMADol 50 MG tablet Commonly known as: ULTRAM Take 1 tablet (50 mg total) by mouth every 6 (six) hours as needed for moderate pain (pain score 4-6).   Turmeric 500 MG Caps Take 1,000 mg by mouth 2 (two) times daily.   VITAMIN C ADULT GUMMIES PO Take 2 each by mouth daily.   ZINC GUMMY PO Take 2 tablets by mouth daily.        Diagnostic Studies: DG HIP UNILAT WITH PELVIS 2-3 VIEWS LEFT  Result Date:  01/15/2023 CLINICAL DATA:  Elective surgery. EXAM: DG HIP (WITH OR WITHOUT PELVIS) 2-3V LEFT COMPARISON:  None Available. FINDINGS: Three fluoroscopic spot views of the pelvis and left hip obtained in the operating room. Images during hip arthroplasty. Fluoroscopy time 23 seconds. Dose 4.87 mGy. IMPRESSION: Intraoperative fluoroscopy during left hip arthroplasty. Electronically Signed   By: Narda Rutherford M.D.   On: 01/15/2023 12:29   DG C-Arm 1-60 Min-No Report  Result Date: 01/15/2023 Fluoroscopy was utilized by the requesting physician.  No radiographic  interpretation.   DG C-Arm 1-60 Min-No Report  Result Date: 01/15/2023 Fluoroscopy was utilized by the requesting physician.  No radiographic interpretation.    Disposition:      Follow-up Information     Konye, Hundley, PA-C Follow up in 2 week(s).   Specialties: Orthopedic Surgery, Emergency Medicine Contact information: [address] [place] [address] [phone]                  Signed: TALLY, EHLKE 01/16/2023, 8:19 AM

## 2023-01-16 NOTE — Anesthesia Postprocedure Evaluation (Signed)
Anesthesia Post Note  Patient: Stephen Schmidt  Procedure(s) Performed: TOTAL HIP ARTHROPLASTY ANTERIOR APPROACH (Left: Hip)  Patient location during evaluation: Nursing Unit Anesthesia Type: Spinal Level of consciousness: awake and alert and oriented Pain management: pain level controlled Vital Signs Assessment: post-procedure vital signs reviewed and stable Respiratory status: respiratory function stable Cardiovascular status: stable Postop Assessment: no headache, no backache, patient able to bend at knees, no apparent nausea or vomiting, able to ambulate and adequate PO intake Anesthetic complications: no   No notable events documented.   Last Vitals:  Vitals:   01/16/23 0005 01/16/23 0600  BP: 138/68 (!) 113/52  Pulse: 90 92  Resp: 17 16  Temp: 37.4 C 37.1 C  SpO2: 96% 96%    Last Pain:  Vitals:   01/16/23 0600  TempSrc: Temporal  PainSc: 0-No pain                 Zachary George

## 2023-01-16 NOTE — Progress Notes (Signed)
Patient is not able to walk the distance required to go the bathroom, or he is unable to safely negotiate stairs required to access the bathroom.  A 3in1 BSC will alleviate this problem.        Lollie Marrow, PA-C [facility]

## 2023-01-16 NOTE — Progress Notes (Signed)
Physical Therapy Treatment Patient Details Name: Stephen Schmidt MRN: [id number] DOB: 1950/08/29 Today's Date: 01/16/2023   History of Present Illness Pt is a 72 yo male s/p L THA. PMH of HTN.    PT Comments  Pt alert, up in recliner finishing session with OT on PT arrival, denied pain. He was able to transfer with supervision with RW, and ambulate >255ft no LOB with CGA-supervision. Noted for improved step through gait pattern and cued for upright trunk. Also able to perform stairs with verbal cues and CGA. Returned to room with needs in reach. The patient would benefit from further skilled PT intervention to continue to progress towards goals.     If plan is discharge home, recommend the following: A little help with bathing/dressing/bathroom;Assistance with cooking/housework;Assist for transportation;Help with stairs or ramp for entrance   Can travel by private vehicle        Equipment Recommendations  Rolling walker (2 wheels);BSC/3in1    Recommendations for Other Services       Precautions / Restrictions Precautions Precautions: Fall;Anterior Hip Precaution Booklet Issued: Yes (comment) Restrictions Weight Bearing Restrictions: Yes LLE Weight Bearing: Weight bearing as tolerated     Mobility  Bed Mobility               General bed mobility comments: up in recliner at start/end of session    Transfers Overall transfer level: Needs assistance Equipment used: Rolling walker (2 wheels) Transfers: Sit to/from Stand Sit to Stand: Supervision                Ambulation/Gait Ambulation/Gait assistance: Contact guard assist, Supervision Gait Distance (Feet): 250 Feet Assistive device: Rolling walker (2 wheels)   Gait velocity: decreased     General Gait Details: improved step through gait, cued for upright posture   Stairs Stairs: Yes Stairs assistance: Contact guard assist Stair Management: Two rails, Step to pattern Number of Stairs: 4 General stair  comments: able to demonstrate safe technique   Wheelchair Mobility     Tilt Bed    Modified Rankin (Stroke Patients Only)       Balance Overall balance assessment: No apparent balance deficits (not formally assessed) Sitting-balance support: Feet supported Sitting balance-Leahy Scale: Good       Standing balance-Leahy Scale: Fair                              Cognition Arousal: Alert Behavior During Therapy: WFL for tasks assessed/performed Overall Cognitive Status: Within Functional Limits for tasks assessed                                          Exercises      General Comments        Pertinent Vitals/Pain Pain Assessment Pain Assessment: No/denies pain    Home Living Family/patient expects to be discharged to:: Private residence (going to stay at his son's house)   Available Help at Discharge: Family Type of Home: House Home Access: Level entry;Ramped entrance       Home Layout: One level Home Equipment: Cane - quad      Prior Function            PT Goals (current goals can now be found in the care plan section) Progress towards PT goals: Progressing toward goals    Frequency    BID  PT Plan      Co-evaluation              AM-PAC PT "6 Clicks" Mobility   Outcome Measure  Help needed turning from your back to your side while in a flat bed without using bedrails?: None Help needed moving from lying on your back to sitting on the side of a flat bed without using bedrails?: None Help needed moving to and from a bed to a chair (including a wheelchair)?: None Help needed standing up from a chair using your arms (e.g., wheelchair or bedside chair)?: None Help needed to walk in hospital room?: A Little Help needed climbing 3-5 steps with a railing? : A Little 6 Click Score: 22    End of Session Equipment Utilized During Treatment: Gait belt Activity Tolerance: Patient tolerated treatment  well Patient left: in chair;with call bell/phone within reach Nurse Communication: Mobility status PT Visit Diagnosis: Other abnormalities of gait and mobility (R26.89);Difficulty in walking, not elsewhere classified (R26.2);Muscle weakness (generalized) (M62.81);Pain Pain - Right/Left: Left Pain - part of body: Hip     Time: 4742-5956 PT Time Calculation (min) (ACUTE ONLY): 11 min  Charges:    $Gait Training: 8-22 mins PT General Charges $$ ACUTE PT VISIT: 1 Visit                     Olga Coaster PT, DPT 10:19 AM,01/16/23

## 2023-01-16 NOTE — Progress Notes (Signed)
   Subjective: 1 Day Post-Op Procedure(s) (LRB): TOTAL HIP ARTHROPLASTY ANTERIOR APPROACH (Left) Patient reports pain as mild.   Patient is well, and has had no acute complaints or problems Denies any CP, SOB, ABD pain. We will continue therapy today.  Plan is to go Home after hospital stay.  Objective: Vital signs in last 24 hours: Temp:  [97.4 F (36.3 C)-99.4 F (37.4 C)] 98.8 F (37.1 C) (11/22 0600) Pulse Rate:  [58-92] 92 (11/22 0600) Resp:  [10-22] 16 (11/22 0600) BP: (87-150)/(52-89) 113/52 (11/22 0600) SpO2:  [96 %-100 %] 96 % (11/22 0600)  Intake/Output from previous day: 11/21 0701 - 11/22 0700 In: 1800 [P.O.:600; I.V.:900; IV Piggyback:300] Out: 1250 [Urine:900; Blood:350] Intake/Output this shift: No intake/output data recorded.  Recent Labs    01/16/23 0634  HGB 11.3*   Recent Labs    01/16/23 0634  WBC 9.1  RBC 4.03*  HCT 32.5*  PLT 128*   Recent Labs    01/16/23 0634  NA 139  K 4.4  CL 106  CO2 26  BUN 29*  CREATININE 1.53*  GLUCOSE 128*  CALCIUM 8.6*   No results for input(s): "LABPT", "INR" in the last 72 hours.  EXAM General - Patient is Alert, Appropriate, and Oriented Extremity - Neurovascular intact Sensation intact distally Intact pulses distally Dorsiflexion/Plantar flexion intact No cellulitis present Compartment soft Dressing - dressing C/D/I and no drainage Motor Function - intact, moving foot and toes well on exam.   Past Medical History:  Diagnosis Date   Arthritis    Chronic kidney disease    Stage 3   Edema of both legs    with heaviness, swelling, and cramping   Hyperlipidemia    Hypertension    Hypertonicity of bladder    Leukopenia    Obesity, Class I, BMI 30.0-34.9 (see actual BMI)    However notably lighter used to be.  Once 324 pounds in 1996   Thrombocytopenia (HCC)     Assessment/Plan:   1 Day Post-Op Procedure(s) (LRB): TOTAL HIP ARTHROPLASTY ANTERIOR APPROACH (Left) Principal Problem:   S/P  total left hip arthroplasty  Estimated body mass index is 31.1 kg/m as calculated from the following:   Height as of this encounter: 6\' 5"  (1.956 m).   Weight as of this encounter: 119 kg. Advance diet Up with therapy Pain well controlled Labs and VSS Slight elevation of Cr, hold NSAIDS CM to assist with discharge to home with HHPT today  DVT Prophylaxis - Lovenox, TED hose, and SCDs Weight-Bearing as tolerated to left leg   T. Cranston Neighbor, PA-C [facility] 01/16/2023, 8:11 AM

## 2023-01-16 NOTE — Progress Notes (Signed)
Patient discharging home. Dressing clean dry and intact. IV removed. Discharge instructions given to patient, verbalized understanding. Family at bedside to transport patient home.

## 2023-01-16 NOTE — Evaluation (Signed)
Occupational Therapy Evaluation Patient Details Name: Stephen Schmidt MRN: [id number] DOB: 1950-09-26 Today's Date: 01/16/2023   History of Present Illness Pt is a 72 yo male s/p L THA. PMH of HTN.   Clinical Impression   Pt seen for OT evaluation this date, POD#1 from above surgery. Pt was independent in all ADLs prior to surgery, however occasionally using QC for mobility due to L hip pain. Pt is eager to return to PLOF with less pain and improved safety and independence. Pt notes he will be staying with his son initially and will have assist available. Pt currently requires increased time/effort for ADL transfers and mobility. Completed all aspects of standing toileting with mod indep as well as UB/LB dressing with mod indep (increased time/effort). Mild balance deficits noted. Pt denies pain throughout. Pt instructed in self care skills, falls prevention strategies, home/routines modifications, DME/AE for LB bathing and dressing tasks, and compression stocking mgt strategies. Handout provided to support recall and carryover. Pt verbalized understanding. Do not anticipate skilled follow up OT needs. Will sign off.     If plan is discharge home, recommend the following: A little help with bathing/dressing/bathroom;Assistance with cooking/housework;Assist for transportation    Functional Status Assessment  Patient has had a recent decline in their functional status and demonstrates the ability to make significant improvements in function in a reasonable and predictable amount of time.  Equipment Recommendations  BSC/3in1;Other (comment) (2WW, reacher)    Recommendations for Other Services       Precautions / Restrictions Precautions Precautions: Fall;Anterior Hip Precaution Booklet Issued: Yes (comment) Restrictions Weight Bearing Restrictions: Yes LLE Weight Bearing: Weight bearing as tolerated      Mobility Bed Mobility Overal bed mobility: Modified Independent                   Transfers Overall transfer level: Needs assistance Equipment used: Rolling walker (2 wheels) Transfers: Sit to/from Stand Sit to Stand: Contact guard assist, From elevated surface           General transfer comment: elevated to mimic home setup      Balance Overall balance assessment: Mild deficits observed, not formally tested                                         ADL either performed or assessed with clinical judgement   ADL Overall ADL's : Modified independent                                       General ADL Comments: Pt able to complete all aspects of dressing with increased time/effort to complete. Will require assist from son for compression stockings.     Vision         Perception         Praxis         Pertinent Vitals/Pain Pain Assessment Pain Assessment: No/denies pain     Extremity/Trunk Assessment Upper Extremity Assessment Upper Extremity Assessment: Overall WFL for tasks assessed   Lower Extremity Assessment Lower Extremity Assessment: Overall WFL for tasks assessed (s/p L THA)       Communication Communication Communication: No apparent difficulties   Cognition Arousal: Alert Behavior During Therapy: WFL for tasks assessed/performed Overall Cognitive Status: Within Functional Limits for tasks assessed  General Comments       Exercises Other Exercises Other Exercises: Pt educated in home/routines modifications, AE/DME, falls prevention, RW mgt, compression stocking mgt, and activity pacing to maximize safety with recovery.   Shoulder Instructions      Home Living Family/patient expects to be discharged to:: Private residence (going to stay at his son's house)   Available Help at Discharge: Family Type of Home: House Home Access: Level entry;Ramped entrance     Home Layout: One level     Bathroom Shower/Tub: Company secretary: Standard     Home Equipment: Medical laboratory scientific officer - quad          Prior Functioning/Environment Prior Level of Function : Independent/Modified Independent             Mobility Comments: using QC          OT Problem List: Impaired balance (sitting and/or standing)      OT Treatment/Interventions:      OT Goals(Current goals can be found in the care plan section) Acute Rehab OT Goals Patient Stated Goal: go home OT Goal Formulation: All assessment and education complete, DC therapy  OT Frequency:      Co-evaluation              AM-PAC OT "6 Clicks" Daily Activity     Outcome Measure Help from another person eating meals?: None Help from another person taking care of personal grooming?: None Help from another person toileting, which includes using toliet, bedpan, or urinal?: None Help from another person bathing (including washing, rinsing, drying)?: None Help from another person to put on and taking off regular upper body clothing?: None Help from another person to put on and taking off regular lower body clothing?: A Little 6 Click Score: 23   End of Session Equipment Utilized During Treatment: Rolling walker (2 wheels)  Activity Tolerance: Patient tolerated treatment well Patient left: in chair;with call bell/phone within reach;Other (comment) (PT)  OT Visit Diagnosis: Other abnormalities of gait and mobility (R26.89)                Time: 1660-6301 OT Time Calculation (min): 22 min Charges:  OT General Charges $OT Visit: 1 Visit OT Evaluation $OT Eval Low Complexity: 1 Low OT Treatments $Self Care/Home Management : 8-22 mins  Arman Filter., MPH, MS, OTR/L ascom [phone] 01/16/23, 10:14 AM

## 2023-01-16 NOTE — TOC Initial Note (Signed)
Transition of Care [facility]) - Initial/Assessment Note    Patient Details  Name: Stephen Schmidt MRN: [id number] Date of Birth: [birth year]  [facility] of [facility]) CM/SW Contact:    Marlowe Sax, RN Phone Number: 01/16/2023, 9:14 AM  Clinical Narrative:                  The patient is set up with [facility] prior to surgery by surgeons office, RW and 3 in 1 to be delivered to the bedside by [facility]  Expected Discharge Plan: Home w Home Health Services Barriers to Discharge: No Barriers Identified   Patient Goals and CMS Choice            Expected Discharge Plan and Services   Discharge Planning Services: CM Consult   Living arrangements for the past 2 months: Single Family Home Expected Discharge Date: 01/16/23               DME Arranged: Dan Humphreys rolling, 3-N-1 DME Agency: [facility] Date DME Agency Contacted: 01/16/23 Time DME Agency Contacted: [phone] Representative spoke with at DME Agency: Osvaldo Angst Arranged: PT HH Agency: [facility] Date HH Agency Contacted: 01/16/23 Time HH Agency Contacted: [phone] Representative spoke with at [facility] Agency: Morrie Sheldon  Prior Living Arrangements/Services Living arrangements for the past 2 months: Single Family Home   Patient language and need for interpreter reviewed:: Yes Do you feel safe going back to the place where you live?: Yes      Need for Family Participation in Patient Care: Yes (Comment) Care giver support system in place?: Yes (comment)   Criminal Activity/Legal Involvement Pertinent to Current Situation/Hospitalization: No - Comment as needed  Activities of Daily Living   ADL Screening (condition at time of admission) Independently performs ADLs?: Yes (appropriate for developmental age) Is the patient deaf or have difficulty hearing?: No Does the patient have difficulty seeing, even when wearing glasses/contacts?: No Does the patient have difficulty concentrating, remembering, or making decisions?:  No  Permission Sought/Granted   Permission granted to share information with : Yes, Verbal Permission Granted              Emotional Assessment Appearance:: Appears stated age Attitude/Demeanor/Rapport: Gracious Affect (typically observed): Pleasant Orientation: : Oriented to Self, Oriented to Place, Oriented to  Time, Oriented to Situation Alcohol / Substance Use: Not Applicable Psych Involvement: No (comment)  Admission diagnosis:  S/P total left hip arthroplasty [V78.469] Patient Active Problem List   Diagnosis Date Noted   S/P total left hip arthroplasty 01/15/2023   Leukopenia 04/18/2022   Thrombocytopenia (HCC) 04/18/2022   Stage 3a chronic kidney disease (HCC) 04/18/2022   Obesity (BMI 30-39.9) 10/28/2012    Class: Diagnosis of   Essential hypertension 10/28/2012   Edema of both legs - with heaviness, swelling and cramping 10/28/2012   Hyperlipidemia    Impotence of organic origin 10/20/2012   Hypertonicity of bladder 10/20/2012   Sleep related leg cramps 07/02/2012   PCP:  Marisue Ivan, MD Pharmacy:   [facility] [address] [place] - [address] [place] [address] Phone: [phone] Fax: [phone]     Social Determinants of Health (SDOH) Social History: SDOH Screenings   Food Insecurity: No Food Insecurity (01/15/2023)  Housing: Low Risk  (01/15/2023)  Transportation Needs: No Transportation Needs (01/15/2023)  Utilities: Not At Risk (01/15/2023)  Financial Resource Strain: Low Risk  (11/06/2022)   Received from [facility]  Tobacco Use: Low Risk  (01/15/2023)  SDOH Interventions:     Readmission Risk Interventions     No data to display

## 2023-01-17 DIAGNOSIS — G4762 Sleep related leg cramps: Secondary | ICD-10-CM | POA: Diagnosis not present

## 2023-01-17 DIAGNOSIS — Z96642 Presence of left artificial hip joint: Secondary | ICD-10-CM | POA: Diagnosis not present

## 2023-01-17 DIAGNOSIS — Z7982 Long term (current) use of aspirin: Secondary | ICD-10-CM | POA: Diagnosis not present

## 2023-01-17 DIAGNOSIS — D72819 Decreased white blood cell count, unspecified: Secondary | ICD-10-CM | POA: Diagnosis not present

## 2023-01-17 DIAGNOSIS — I129 Hypertensive chronic kidney disease with stage 1 through stage 4 chronic kidney disease, or unspecified chronic kidney disease: Secondary | ICD-10-CM | POA: Diagnosis not present

## 2023-01-17 DIAGNOSIS — E785 Hyperlipidemia, unspecified: Secondary | ICD-10-CM | POA: Diagnosis not present

## 2023-01-17 DIAGNOSIS — Z6829 Body mass index (BMI) 29.0-29.9, adult: Secondary | ICD-10-CM | POA: Diagnosis not present

## 2023-01-17 DIAGNOSIS — E669 Obesity, unspecified: Secondary | ICD-10-CM | POA: Diagnosis not present

## 2023-01-17 DIAGNOSIS — Z7901 Long term (current) use of anticoagulants: Secondary | ICD-10-CM | POA: Diagnosis not present

## 2023-01-17 DIAGNOSIS — Z471 Aftercare following joint replacement surgery: Secondary | ICD-10-CM | POA: Diagnosis not present

## 2023-01-17 DIAGNOSIS — D696 Thrombocytopenia, unspecified: Secondary | ICD-10-CM | POA: Diagnosis not present

## 2023-01-17 DIAGNOSIS — N318 Other neuromuscular dysfunction of bladder: Secondary | ICD-10-CM | POA: Diagnosis not present

## 2023-01-17 DIAGNOSIS — N1831 Chronic kidney disease, stage 3a: Secondary | ICD-10-CM | POA: Diagnosis not present

## 2023-02-12 DIAGNOSIS — M25552 Pain in left hip: Secondary | ICD-10-CM | POA: Diagnosis not present

## 2023-02-12 DIAGNOSIS — Z96642 Presence of left artificial hip joint: Secondary | ICD-10-CM | POA: Diagnosis not present

## 2023-02-19 DIAGNOSIS — Z96642 Presence of left artificial hip joint: Secondary | ICD-10-CM | POA: Diagnosis not present

## 2023-02-19 DIAGNOSIS — M2041 Other hammer toe(s) (acquired), right foot: Secondary | ICD-10-CM | POA: Diagnosis not present

## 2023-02-19 DIAGNOSIS — L600 Ingrowing nail: Secondary | ICD-10-CM | POA: Diagnosis not present

## 2023-02-19 DIAGNOSIS — M778 Other enthesopathies, not elsewhere classified: Secondary | ICD-10-CM | POA: Diagnosis not present

## 2023-02-19 DIAGNOSIS — B351 Tinea unguium: Secondary | ICD-10-CM | POA: Diagnosis not present

## 2023-02-19 DIAGNOSIS — M2012 Hallux valgus (acquired), left foot: Secondary | ICD-10-CM | POA: Diagnosis not present

## 2023-02-19 DIAGNOSIS — M898X9 Other specified disorders of bone, unspecified site: Secondary | ICD-10-CM | POA: Diagnosis not present

## 2023-02-19 DIAGNOSIS — M2011 Hallux valgus (acquired), right foot: Secondary | ICD-10-CM | POA: Diagnosis not present

## 2023-02-19 DIAGNOSIS — M2042 Other hammer toe(s) (acquired), left foot: Secondary | ICD-10-CM | POA: Diagnosis not present

## 2023-02-23 DIAGNOSIS — Z96642 Presence of left artificial hip joint: Secondary | ICD-10-CM | POA: Diagnosis not present

## 2023-02-23 DIAGNOSIS — M25552 Pain in left hip: Secondary | ICD-10-CM | POA: Diagnosis not present

## 2023-02-26 DIAGNOSIS — I10 Essential (primary) hypertension: Secondary | ICD-10-CM | POA: Diagnosis not present

## 2023-02-26 DIAGNOSIS — Z96642 Presence of left artificial hip joint: Secondary | ICD-10-CM | POA: Diagnosis not present

## 2023-02-26 DIAGNOSIS — E7800 Pure hypercholesterolemia, unspecified: Secondary | ICD-10-CM | POA: Diagnosis not present

## 2023-02-26 DIAGNOSIS — M25552 Pain in left hip: Secondary | ICD-10-CM | POA: Diagnosis not present

## 2023-03-03 DIAGNOSIS — Z96642 Presence of left artificial hip joint: Secondary | ICD-10-CM | POA: Diagnosis not present

## 2023-03-06 DIAGNOSIS — Z96642 Presence of left artificial hip joint: Secondary | ICD-10-CM | POA: Diagnosis not present

## 2023-03-06 DIAGNOSIS — M25552 Pain in left hip: Secondary | ICD-10-CM | POA: Diagnosis not present

## 2023-03-09 DIAGNOSIS — Z96642 Presence of left artificial hip joint: Secondary | ICD-10-CM | POA: Diagnosis not present

## 2023-03-09 DIAGNOSIS — M25552 Pain in left hip: Secondary | ICD-10-CM | POA: Diagnosis not present

## 2023-03-12 DIAGNOSIS — Z96642 Presence of left artificial hip joint: Secondary | ICD-10-CM | POA: Diagnosis not present

## 2023-03-16 DIAGNOSIS — M25552 Pain in left hip: Secondary | ICD-10-CM | POA: Diagnosis not present

## 2023-03-16 DIAGNOSIS — Z96642 Presence of left artificial hip joint: Secondary | ICD-10-CM | POA: Diagnosis not present

## 2023-03-19 DIAGNOSIS — Z96642 Presence of left artificial hip joint: Secondary | ICD-10-CM | POA: Diagnosis not present

## 2023-03-23 DIAGNOSIS — Z96642 Presence of left artificial hip joint: Secondary | ICD-10-CM | POA: Diagnosis not present

## 2023-03-23 DIAGNOSIS — M25552 Pain in left hip: Secondary | ICD-10-CM | POA: Diagnosis not present

## 2023-03-26 DIAGNOSIS — Z96642 Presence of left artificial hip joint: Secondary | ICD-10-CM | POA: Diagnosis not present

## 2023-03-31 DIAGNOSIS — M25552 Pain in left hip: Secondary | ICD-10-CM | POA: Diagnosis not present

## 2023-03-31 DIAGNOSIS — Z96642 Presence of left artificial hip joint: Secondary | ICD-10-CM | POA: Diagnosis not present

## 2023-04-03 DIAGNOSIS — M25552 Pain in left hip: Secondary | ICD-10-CM | POA: Diagnosis not present

## 2023-04-03 DIAGNOSIS — Z96642 Presence of left artificial hip joint: Secondary | ICD-10-CM | POA: Diagnosis not present

## 2023-04-06 DIAGNOSIS — Z96642 Presence of left artificial hip joint: Secondary | ICD-10-CM | POA: Diagnosis not present

## 2023-04-06 DIAGNOSIS — M25552 Pain in left hip: Secondary | ICD-10-CM | POA: Diagnosis not present

## 2023-04-07 DIAGNOSIS — Z96642 Presence of left artificial hip joint: Secondary | ICD-10-CM | POA: Diagnosis not present

## 2023-04-09 DIAGNOSIS — Z96642 Presence of left artificial hip joint: Secondary | ICD-10-CM | POA: Diagnosis not present

## 2023-04-09 DIAGNOSIS — M25552 Pain in left hip: Secondary | ICD-10-CM | POA: Diagnosis not present

## 2023-04-14 DIAGNOSIS — Z96642 Presence of left artificial hip joint: Secondary | ICD-10-CM | POA: Diagnosis not present

## 2023-04-19 NOTE — Assessment & Plan Note (Deleted)
 Chronic thrombocytopenia, count is slightly decreased compared to his baseline. Denies routine alcohol use, quinine water use.  Immature platelet count normal, physical examination is negative for significant splenomegaly. Recommend observation.  May need bone marrow biopsy if platelet count progressively decreases

## 2023-04-20 ENCOUNTER — Inpatient Hospital Stay: Payer: PPO | Admitting: Oncology

## 2023-04-20 ENCOUNTER — Inpatient Hospital Stay: Payer: PPO

## 2023-04-20 DIAGNOSIS — D696 Thrombocytopenia, unspecified: Secondary | ICD-10-CM

## 2023-04-21 DIAGNOSIS — Z96642 Presence of left artificial hip joint: Secondary | ICD-10-CM | POA: Diagnosis not present

## 2023-04-21 DIAGNOSIS — M25552 Pain in left hip: Secondary | ICD-10-CM | POA: Diagnosis not present

## 2023-04-23 DIAGNOSIS — B351 Tinea unguium: Secondary | ICD-10-CM | POA: Diagnosis not present

## 2023-04-23 DIAGNOSIS — Z96642 Presence of left artificial hip joint: Secondary | ICD-10-CM | POA: Diagnosis not present

## 2023-04-23 DIAGNOSIS — M79675 Pain in left toe(s): Secondary | ICD-10-CM | POA: Diagnosis not present

## 2023-04-23 DIAGNOSIS — M79674 Pain in right toe(s): Secondary | ICD-10-CM | POA: Diagnosis not present

## 2023-04-23 DIAGNOSIS — M25552 Pain in left hip: Secondary | ICD-10-CM | POA: Diagnosis not present

## 2023-04-23 DIAGNOSIS — I10 Essential (primary) hypertension: Secondary | ICD-10-CM | POA: Diagnosis not present

## 2023-04-23 DIAGNOSIS — E7800 Pure hypercholesterolemia, unspecified: Secondary | ICD-10-CM | POA: Diagnosis not present

## 2023-04-28 DIAGNOSIS — M1611 Unilateral primary osteoarthritis, right hip: Secondary | ICD-10-CM | POA: Diagnosis not present

## 2023-04-28 DIAGNOSIS — M25551 Pain in right hip: Secondary | ICD-10-CM | POA: Diagnosis not present

## 2023-04-28 DIAGNOSIS — M25552 Pain in left hip: Secondary | ICD-10-CM | POA: Diagnosis not present

## 2023-04-29 ENCOUNTER — Other Ambulatory Visit: Payer: Self-pay | Admitting: Orthopedic Surgery

## 2023-04-30 DIAGNOSIS — Z96642 Presence of left artificial hip joint: Secondary | ICD-10-CM | POA: Diagnosis not present

## 2023-04-30 DIAGNOSIS — M25552 Pain in left hip: Secondary | ICD-10-CM | POA: Diagnosis not present

## 2023-04-30 DIAGNOSIS — E7800 Pure hypercholesterolemia, unspecified: Secondary | ICD-10-CM | POA: Diagnosis not present

## 2023-04-30 DIAGNOSIS — I10 Essential (primary) hypertension: Secondary | ICD-10-CM | POA: Diagnosis not present

## 2023-04-30 DIAGNOSIS — D72819 Decreased white blood cell count, unspecified: Secondary | ICD-10-CM | POA: Diagnosis not present

## 2023-05-05 ENCOUNTER — Telehealth: Payer: Self-pay | Admitting: Oncology

## 2023-05-05 NOTE — Telephone Encounter (Signed)
 Patient called to reschedule appointment to earlier on 3/18. 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   Appointment rescheduled as requested

## 2023-05-06 ENCOUNTER — Encounter
Admission: RE | Admit: 2023-05-06 | Discharge: 2023-05-06 | Disposition: A | Discharge status: Home / Self Care | Source: Ambulatory Visit | Attending: Orthopedic Surgery | Admitting: Orthopedic Surgery

## 2023-05-06 VITALS — BP 133/59 | Resp 12 | Ht 77.0 in | Wt 243.2 lb

## 2023-05-06 DIAGNOSIS — I499 Cardiac arrhythmia, unspecified: Secondary | ICD-10-CM | POA: Diagnosis not present

## 2023-05-06 DIAGNOSIS — Z01810 Encounter for preprocedural cardiovascular examination: Secondary | ICD-10-CM | POA: Diagnosis not present

## 2023-05-06 DIAGNOSIS — I10 Essential (primary) hypertension: Secondary | ICD-10-CM | POA: Insufficient documentation

## 2023-05-06 DIAGNOSIS — Z01818 Encounter for other preprocedural examination: Secondary | ICD-10-CM | POA: Diagnosis not present

## 2023-05-06 HISTORY — DX: Chronic kidney disease, stage 3 unspecified: N18.30

## 2023-05-06 HISTORY — DX: Other intervertebral disc degeneration, lumbosacral region without mention of lumbar back pain or lower extremity pain: M51.379

## 2023-05-06 HISTORY — DX: Bilateral primary osteoarthritis of hip: M16.0

## 2023-05-06 LAB — URINALYSIS, ROUTINE W REFLEX MICROSCOPIC
Bilirubin Urine: NEGATIVE
Glucose, UA: NEGATIVE mg/dL
Hgb urine dipstick: NEGATIVE
Ketones, ur: NEGATIVE mg/dL
Leukocytes,Ua: NEGATIVE
Nitrite: NEGATIVE
Protein, ur: NEGATIVE mg/dL
Specific Gravity, Urine: 1.012 (ref 1.005–1.030)
pH: 6 (ref 5.0–8.0)

## 2023-05-06 LAB — TYPE AND SCREEN
ABO/RH(D): O POS
Antibody Screen: NEGATIVE

## 2023-05-06 LAB — SURGICAL PCR SCREEN
MRSA, PCR: NEGATIVE
Staphylococcus aureus: NEGATIVE

## 2023-05-06 NOTE — Patient Instructions (Addendum)
 Your procedure is scheduled on:05-18-23 Monday Report to the Registration Desk on the 1st floor of the [facility].Then proceed to the 2nd floor Surgery Desk To find out your arrival time, please call [phone] between 1PM - 3PM on:05-15-23 Friday If your arrival time is 6:00 am, do not arrive before that time as the [facility] entrance doors do not open until 6:00 am.  REMEMBER: Instructions that are not followed completely may result in serious medical risk, up to and including death; or upon the discretion of your surgeon and anesthesiologist your surgery may need to be rescheduled.  Do not eat food after midnight the night before surgery.  No gum chewing or hard candies.  You may however, drink CLEAR liquids up to 2 hours before you are scheduled to arrive for your surgery. Do not drink anything within 2 hours of your scheduled arrival time.  Clear liquids include: - water  - apple juice without pulp - gatorade (not RED colors) - black coffee or tea (Do NOT add milk or creamers to the coffee or tea) Do NOT drink anything that is not on this list.  In addition, your doctor has ordered for you to drink the provided:  Ensure Pre-Surgery Clear Carbohydrate Drink  Drinking this carbohydrate drink up to two hours before surgery helps to reduce insulin resistance and improve patient outcomes. Please complete drinking 2 hours before scheduled arrival time.  One week prior to surgery:Last dose will be on 05-10-23 Stop Anti-inflammatories (NSAIDS) such as Advil, Aleve, Ibuprofen, Motrin, Naproxen, Naprosyn and Aspirin based products such as Excedrin, Goody's Powder, BC Powder. Stop ANY OVER THE COUNTER supplements until after surgery (Apple Cider Vinegar, Vitamin C, Calcium, Co Q-10, Magnesium, Multivitamin, Fish Oil, Chaga Mushroom, Turmeric, Zinc)  You may however, continue to take Tylenol if needed for pain up until the day of surgery  Continue taking all of your other prescription  medications up until the day of surgery.  Do NOT take any medication the day of surgery  Continue 81 mg Aspirin (Do not take the 2 tablets like you have been taking every day-only take 1 tablet ) up until the day prior to surgery-Do NOT take the morning of surgery  No Alcohol for 24 hours before or after surgery.  No Smoking including e-cigarettes for 24 hours before surgery.  No chewable tobacco products for at least 6 hours before surgery.  No nicotine patches on the day of surgery.  Do not use any "recreational" drugs for at least a week (preferably 2 weeks) before your surgery.  Please be advised that the combination of cocaine and anesthesia may have negative outcomes, up to and including death. If you test positive for cocaine, your surgery will be cancelled.  On the morning of surgery brush your teeth with toothpaste and water, you may rinse your mouth with mouthwash if you wish. Do not swallow any toothpaste or mouthwash.  Use CHG Soap as directed on instruction sheet.  Do not wear jewelry, make-up, hairpins, clips or nail polish.  For welded (permanent) jewelry: bracelets, anklets, waist bands, etc.  Please have this removed prior to surgery.  If it is not removed, there is a chance that hospital personnel will need to cut it off on the day of surgery.  Do not wear lotions, powders, or perfumes.   Do not shave body hair from the neck down 48 hours before surgery.  Contact lenses, hearing aids and dentures may not be worn into surgery.  Do  not bring valuables to the hospital. [facility] is not responsible for any missing/lost belongings or valuables.   Notify your doctor if there is any change in your medical condition (cold, fever, infection).  Wear comfortable clothing (specific to your surgery type) to the hospital.  After surgery, you can help prevent lung complications by doing breathing exercises.  Take deep breaths and cough every 1-2 hours. Your doctor may order  a device called an Incentive Spirometer to help you take deep breaths. When coughing or sneezing, hold a pillow firmly against your incision with both hands. This is called "splinting." Doing this helps protect your incision. It also decreases belly discomfort.  If you are being admitted to the hospital overnight, leave your suitcase in the car. After surgery it may be brought to your room.  In case of increased patient census, it may be necessary for you, the patient, to continue your postoperative care in the Same Day Surgery department.  If you are being discharged the day of surgery, you will not be allowed to drive home. You will need a responsible individual to drive you home and stay with you for 24 hours after surgery.   If you are taking public transportation, you will need to have a responsible individual with you.  Please call the Pre-admissions Testing Dept. at [phone] if you have any questions about these instructions.  Surgery Visitation Policy:  Patients having surgery or a procedure may have two visitors.  Children under the age of 11 must have an adult with them who is not the patient.  Temporary Visitor Restrictions Due to increasing cases of flu, RSV and COVID-19: Children ages 54 and under will not be able to visit patients in [facility] hospitals under most circumstances.  Inpatient Visitation:    Visiting hours are 7 a.m. to 8 p.m. Up to four visitors are allowed at one time in a patient room. The visitors may rotate out with other people during the day.  One visitor age 2 or older may stay with the patient overnight and must be in the room by 8 p.m.    Pre-operative 5 CHG Bath Instructions   You can play a key role in reducing the risk of infection after surgery. Your skin needs to be as free of germs as possible. You can reduce the number of germs on your skin by washing with CHG (chlorhexidine gluconate) soap before surgery. CHG is an antiseptic soap  that kills germs and continues to kill germs even after washing.   DO NOT use if you have an allergy to chlorhexidine/CHG or antibacterial soaps. If your skin becomes reddened or irritated, stop using the CHG and notify one of our RNs at [phone].   Please shower with the CHG soap starting 4 days before surgery using the following schedule:     Please keep in mind the following:  DO NOT shave, including legs and underarms, starting the day of your first shower.   You may shave your face at any point before/day of surgery.  Place clean sheets on your bed the day you start using CHG soap. Use a clean washcloth (not used since being washed) for each shower. DO NOT sleep with pets once you start using the CHG.   CHG Shower Instructions:  If you choose to wash your hair and private area, wash first with your normal shampoo/soap.  After you use shampoo/soap, rinse your hair and body thoroughly to remove shampoo/soap residue.  Turn the  water OFF and apply about 3 tablespoons (45 ml) of CHG soap to a CLEAN washcloth.  Apply CHG soap ONLY FROM YOUR NECK DOWN TO YOUR TOES (washing for 3-5 minutes)  DO NOT use CHG soap on face, private areas, open wounds, or sores.  Pay special attention to the area where your surgery is being performed.  If you are having back surgery, having someone wash your back for you may be helpful. Wait 2 minutes after CHG soap is applied, then you may rinse off the CHG soap.  Pat dry with a clean towel  Put on clean clothes/pajamas   If you choose to wear lotion, please use ONLY the CHG-compatible lotions on the back of this paper.     Additional instructions for the day of surgery: DO NOT APPLY any lotions, deodorants, cologne, or perfumes.   Put on clean/comfortable clothes.  Brush your teeth.  Ask your nurse before applying any prescription medications to the skin.      CHG Compatible Lotions   Aveeno Moisturizing lotion  Cetaphil Moisturizing Cream   Cetaphil Moisturizing Lotion  Clairol Herbal Essence Moisturizing Lotion, Dry Skin  Clairol Herbal Essence Moisturizing Lotion, Extra Dry Skin  Clairol Herbal Essence Moisturizing Lotion, Normal Skin  Curel Age Defying Therapeutic Moisturizing Lotion with Alpha Hydroxy  Curel Extreme Care Body Lotion  Curel Soothing Hands Moisturizing Hand Lotion  Curel Therapeutic Moisturizing Cream, Fragrance-Free  Curel Therapeutic Moisturizing Lotion, Fragrance-Free  Curel Therapeutic Moisturizing Lotion, Original Formula  Eucerin Daily Replenishing Lotion  Eucerin Dry Skin Therapy Plus Alpha Hydroxy Crme  Eucerin Dry Skin Therapy Plus Alpha Hydroxy Lotion  Eucerin Original Crme  Eucerin Original Lotion  Eucerin Plus Crme Eucerin Plus Lotion  Eucerin TriLipid Replenishing Lotion  Keri Anti-Bacterial Hand Lotion  Keri Deep Conditioning Original Lotion Dry Skin Formula Softly Scented  Keri Deep Conditioning Original Lotion, Fragrance Free Sensitive Skin Formula  Keri Lotion Fast Absorbing Fragrance Free Sensitive Skin Formula  Keri Lotion Fast Absorbing Softly Scented Dry Skin Formula  Keri Original Lotion  Keri Skin Renewal Lotion Keri Silky Smooth Lotion  Keri Silky Smooth Sensitive Skin Lotion  Nivea Body Creamy Conditioning Oil  Nivea Body Extra Enriched Lotion  Nivea Body Original Lotion  Nivea Body Sheer Moisturizing Lotion Nivea Crme  Nivea Skin Firming Lotion  NutraDerm 30 Skin Lotion  NutraDerm Skin Lotion  NutraDerm Therapeutic Skin Cream  NutraDerm Therapeutic Skin Lotion  ProShield Protective Hand Cream  Provon moisturizing lotion  How to Use an Incentive Spirometer An incentive spirometer is a tool that measures how well you are filling your lungs with each breath. Learning to take long, deep breaths using this tool can help you keep your lungs clear and active. This may help to reverse or lessen your chance of developing breathing (pulmonary) problems, especially infection.  You may be asked to use a spirometer: After a surgery. If you have a lung problem or a history of smoking. After a long period of time when you have been unable to move or be active. If the spirometer includes an indicator to show the highest number that you have reached, your health care provider or respiratory therapist will help you set a goal. Keep a log of your progress as told by your health care provider. What are the risks? Breathing too quickly may cause dizziness or cause you to pass out. Take your time so you do not get dizzy or light-headed. If you are in pain, you may need to take pain  medicine before doing incentive spirometry. It is harder to take a deep breath if you are having pain. How to use your incentive spirometer  Sit up on the edge of your bed or on a chair. Hold the incentive spirometer so that it is in an upright position. Before you use the spirometer, breathe out normally. Place the mouthpiece in your mouth. Make sure your lips are closed tightly around it. Breathe in slowly and as deeply as you can through your mouth, causing the piston or the ball to rise toward the top of the chamber. Hold your breath for 3-5 seconds, or for as long as possible. If the spirometer includes a coach indicator, use this to guide you in breathing. Slow down your breathing if the indicator goes above the marked areas. Remove the mouthpiece from your mouth and breathe out normally. The piston or ball will return to the bottom of the chamber. Rest for a few seconds, then repeat the steps 10 or more times. Take your time and take a few normal breaths between deep breaths so that you do not get dizzy or light-headed. Do this every 1-2 hours when you are awake. If the spirometer includes a goal marker to show the highest number you have reached (best effort), use this as a goal to work toward during each repetition. After each set of 10 deep breaths, cough a few times. This will help to make  sure that your lungs are clear. If you have an incision on your chest or abdomen from surgery, place a pillow or a rolled-up towel firmly against the incision when you cough. This can help to reduce pain while taking deep breaths and coughing. General tips When you are able to get out of bed: Walk around often. Continue to take deep breaths and cough in order to clear your lungs. Keep using the incentive spirometer until your health care provider says it is okay to stop using it. If you have been in the hospital, you may be told to keep using the spirometer at home. Contact a health care provider if: You are having difficulty using the spirometer. You have trouble using the spirometer as often as instructed. Your pain medicine is not giving enough relief for you to use the spirometer as told. You have a fever. Get help right away if: You develop shortness of breath. You develop a cough with bloody mucus from the lungs. You have fluid or blood coming from an incision site after you cough. Summary An incentive spirometer is a tool that can help you learn to take long, deep breaths to keep your lungs clear and active. You may be asked to use a spirometer after a surgery, if you have a lung problem or a history of smoking, or if you have been inactive for a long period of time. Use your incentive spirometer as instructed every 1-2 hours while you are awake. If you have an incision on your chest or abdomen, place a pillow or a rolled-up towel firmly against your incision when you cough. This will help to reduce pain. Get help right away if you have shortness of breath, you cough up bloody mucus, or blood comes from your incision when you cough. This information is not intended to replace advice given to you by your health care provider. Make sure you discuss any questions you have with your health care provider.  Preoperative Educational Videos for Total Hip, Knee and Shoulder Replacements  To  better prepare for  surgery, please view our videos that explain the physical activity and discharge planning required to have the best surgical recovery at [facility].  [email or web address]  Questions? Call [phone] or email [email or web address]

## 2023-05-07 DIAGNOSIS — M25552 Pain in left hip: Secondary | ICD-10-CM | POA: Diagnosis not present

## 2023-05-07 DIAGNOSIS — Z96642 Presence of left artificial hip joint: Secondary | ICD-10-CM | POA: Diagnosis not present

## 2023-05-12 ENCOUNTER — Inpatient Hospital Stay (HOSPITAL_BASED_OUTPATIENT_CLINIC_OR_DEPARTMENT_OTHER): Admitting: Oncology

## 2023-05-12 ENCOUNTER — Encounter: Payer: Self-pay | Admitting: Oncology

## 2023-05-12 ENCOUNTER — Inpatient Hospital Stay: Attending: Oncology

## 2023-05-12 ENCOUNTER — Other Ambulatory Visit: Payer: PPO

## 2023-05-12 ENCOUNTER — Ambulatory Visit: Payer: PPO | Admitting: Oncology

## 2023-05-12 VITALS — BP 139/66 | HR 50 | Temp 97.7°F | Resp 18 | Wt 243.8 lb

## 2023-05-12 DIAGNOSIS — D72819 Decreased white blood cell count, unspecified: Secondary | ICD-10-CM

## 2023-05-12 DIAGNOSIS — D696 Thrombocytopenia, unspecified: Secondary | ICD-10-CM | POA: Insufficient documentation

## 2023-05-12 DIAGNOSIS — Z79899 Other long term (current) drug therapy: Secondary | ICD-10-CM | POA: Insufficient documentation

## 2023-05-12 DIAGNOSIS — Z9884 Bariatric surgery status: Secondary | ICD-10-CM | POA: Insufficient documentation

## 2023-05-12 DIAGNOSIS — N1831 Chronic kidney disease, stage 3a: Secondary | ICD-10-CM

## 2023-05-12 DIAGNOSIS — Z7982 Long term (current) use of aspirin: Secondary | ICD-10-CM | POA: Insufficient documentation

## 2023-05-12 DIAGNOSIS — I129 Hypertensive chronic kidney disease with stage 1 through stage 4 chronic kidney disease, or unspecified chronic kidney disease: Secondary | ICD-10-CM | POA: Insufficient documentation

## 2023-05-12 LAB — CMP (CANCER CENTER ONLY)
ALT: 10 U/L (ref 0–44)
AST: 17 U/L (ref 15–41)
Albumin: 3.8 g/dL (ref 3.5–5.0)
Alkaline Phosphatase: 52 U/L (ref 38–126)
Anion gap: 8 (ref 5–15)
BUN: 21 mg/dL (ref 8–23)
CO2: 28 mmol/L (ref 22–32)
Calcium: 9 mg/dL (ref 8.9–10.3)
Chloride: 102 mmol/L (ref 98–111)
Creatinine: 1.08 mg/dL (ref 0.61–1.24)
GFR, Estimated: >60 mL/min (ref >60)
Glucose, Bld: 112 mg/dL — ABNORMAL HIGH (ref 70–99)
Potassium: 4.1 mmol/L (ref 3.5–5.1)
Sodium: 138 mmol/L (ref 135–145)
Total Bilirubin: 0.8 mg/dL (ref 0.0–1.2)
Total Protein: 7.3 g/dL (ref 6.5–8.1)

## 2023-05-12 LAB — CBC WITH DIFFERENTIAL (CANCER CENTER ONLY)
Abs Immature Granulocytes: 0 10*3/uL (ref 0.00–0.07)
Basophils Absolute: 0 10*3/uL (ref 0.0–0.1)
Basophils Relative: 0 %
Eosinophils Absolute: 0.1 10*3/uL (ref 0.0–0.5)
Eosinophils Relative: 2 %
HCT: 38.7 % — ABNORMAL LOW (ref 39.0–52.0)
Hemoglobin: 12.8 g/dL — ABNORMAL LOW (ref 13.0–17.0)
Immature Granulocytes: 0 %
Lymphocytes Relative: 36 %
Lymphs Abs: 1.2 10*3/uL (ref 0.7–4.0)
MCH: 27.9 pg (ref 26.0–34.0)
MCHC: 33.1 g/dL (ref 30.0–36.0)
MCV: 84.5 fL (ref 80.0–100.0)
Monocytes Absolute: 0.3 10*3/uL (ref 0.1–1.0)
Monocytes Relative: 10 %
Neutro Abs: 1.7 10*3/uL (ref 1.7–7.7)
Neutrophils Relative %: 52 %
Platelet Count: 167 10*3/uL (ref 150–400)
RBC: 4.58 MIL/uL (ref 4.22–5.81)
RDW: 15 % (ref 11.5–15.5)
WBC Count: 3.3 10*3/uL — ABNORMAL LOW (ref 4.0–10.5)
nRBC: 0 % (ref 0.0–0.2)

## 2023-05-12 LAB — VITAMIN B12: Vitamin B-12: 1288 pg/mL — ABNORMAL HIGH (ref 180–914)

## 2023-05-12 LAB — FOLATE: Folate: 15.2 ng/mL (ref >5.9)

## 2023-05-12 LAB — IMMATURE PLATELET FRACTION: Immature Platelet Fraction: 1.7 % (ref 1.2–8.6)

## 2023-05-12 NOTE — Assessment & Plan Note (Signed)
Labs reviewed and discussed with patient Leukopenia is very mild, with no decrease of differential groups. Patient has had negative work-up previously. 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 Continue observation.

## 2023-05-12 NOTE — Assessment & Plan Note (Addendum)
 Chronic thrombocytopenia, count is slightly decreased compared to his baseline. Denies routine alcohol use, quinine water use.  Immature platelet count normal, physical examination is negative for significant splenomegaly. Labs are reviewed and discussed with patient. Thrombocytopenia has resolved  Recommend observation.

## 2023-05-12 NOTE — Assessment & Plan Note (Signed)
 Encourage oral hydration and avoid nephrotoxins.

## 2023-05-12 NOTE — Progress Notes (Signed)
 Hematology/Oncology Progress note [phone] [phone]      CHIEF COMPLAINTS/REASON FOR VISIT:   Thrombocytopenia  ASSESSMENT & PLAN:   Thrombocytopenia (HCC) Chronic thrombocytopenia, count is slightly decreased compared to his baseline. Denies routine alcohol use, quinine water use.  Immature platelet count normal, physical examination is negative for significant splenomegaly. Labs are reviewed and discussed with patient. Thrombocytopenia has resolved  Recommend observation.    Leukopenia Labs reviewed and discussed with patient Leukopenia is very mild, with no decrease of differential groups. Patient has had negative work-up previously. 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 Continue observation.  Stage 3a chronic kidney disease (HCC) Encourage oral hydration and avoid nephrotoxins.      Orders Placed This Encounter  Procedures   CMP (Cancer Center only)    Standing Status:   Future    Expected Date:   05/11/2024    Expiration Date:   05/11/2024   CBC with Differential (Cancer Center Only)    Standing Status:   Future    Expected Date:   05/11/2024    Expiration Date:   05/11/2024   Vitamin B12    Standing Status:   Future    Expected Date:   05/11/2024    Expiration Date:   05/11/2024   Folate    Standing Status:   Future    Expected Date:   05/11/2024    Expiration Date:   05/11/2024   CBC with Differential (Cancer Center Only)    Standing Status:   Future    Expected Date:   05/11/2024    Expiration Date:   05/11/2024   CMP (Cancer Center only)    Standing Status:   Future    Expected Date:   05/11/2024    Expiration Date:   05/11/2024   Vitamin B12    Standing Status:   Future    Expected Date:   05/11/2024    Expiration Date:   05/11/2024   Folate    Standing Status:   Future    Expected Date:   05/11/2024    Expiration Date:   05/11/2024   Follow-up in a year. All questions were answered. The patient knows to call the  clinic with any problems, questions or concerns.  Rickard Patience, MD, PhD [facility] Hematology Oncology 05/12/2023    HISTORY OF PRESENTING ILLNESS:  Stephen Schmidt is a  73 y.o.  male with PMH listed below was seen in consultation at the request of  Marisue Ivan, MD  for evaluation of pancytopenia.  Reviewed patient's labs.  Labs showed decreased platelet counts at 1 35,000, hemoglobin 13.1, WBC 3.2, Reviewed patient's previous labs.  Anemia started 6 months ago.  02/02/2018 labs showed hemoglobin 12.8, normal WBC 4.3, normal platelet count 155. No aggravating or elevated factors.  Associated symptoms or signs:  Denies weight loss, fever, chills, fatigue, night sweats.  Denies hematochezia, hematuria, hematemesis, epistaxis, black tarry stool.  easy bruising.   Context:  History of hepatitis or HIV infection,  History of gastric bypass History of autoimmune disease.   Alcohol use, patient drinks wine or beer to 2 times a week. Admit to drink tonic water with quinine for the past few years for the treatment of bilateral lower extremity cramps. Quinine tonic water helps relieving his lower extremity cramping symptoms-patient has stopped quinine water.  INTERVAL HISTORY Stephen Schmidt is a 73 y.o. male who has above history reviewed by me today presents for follow up visit for management of leukemia.  Patient reports feeling well.   Denies any constitutional symptoms.  Denies any easy bruising or bleeding.  . Review of Systems  Constitutional:  Negative for appetite change, chills, fatigue, fever and unexpected weight change.  HENT:   Negative for hearing loss and voice change.   Eyes:  Negative for eye problems and icterus.  Respiratory:  Negative for chest tightness, cough and shortness of breath.   Cardiovascular:  Negative for chest pain and leg swelling.  Gastrointestinal:  Negative for abdominal distention and abdominal pain.  Endocrine: Negative for hot flashes.   Genitourinary:  Negative for difficulty urinating, dysuria and frequency.   Musculoskeletal:  Negative for arthralgias.  Skin:  Negative for itching and rash.  Neurological:  Negative for light-headedness and numbness.  Hematological:  Negative for adenopathy. Does not bruise/bleed easily.  Psychiatric/Behavioral:  Negative for confusion.     MEDICAL HISTORY:  Past Medical History:  Diagnosis Date   CKD (chronic kidney disease) stage 3, GFR 30-59 ml/min (HCC)    DDD (degenerative disc disease), lumbosacral    Edema of both legs    with heaviness, swelling, and cramping   Hyperlipidemia    Hypertension    Hypertonicity of bladder    Leukopenia    Obesity, Class I, BMI 30.0-34.9 (see actual BMI)    However notably lighter used to be.  Once 324 pounds in 1996   Osteoarthritis of both hips    Thrombocytopenia (HCC)     SURGICAL HISTORY: Past Surgical History:  Procedure Laterality Date   SHOULDER ARTHROSCOPY WITH OPEN ROTATOR CUFF REPAIR Right 02/15/2018   Procedure: SHOULDER ARTHROSCOPY WITH OPEN ROTATOR CUFF REPAIR,SUBACROMINAL DECOMPRESSION,DISTAL CLAVICLE EXCISION,BICEPS TENODESIS, & SUBSCAPULARIS .;  Surgeon: Signa Kell, MD;  Location: [facility];  Service: Orthopedics;  Laterality: Right;   TONSILLECTOMY     TOTAL HIP ARTHROPLASTY Left 01/15/2023   Procedure: TOTAL HIP ARTHROPLASTY ANTERIOR APPROACH;  Surgeon: Reinaldo Berber, MD;  Location: [facility];  Service: Orthopedics;  Laterality: Left;    SOCIAL HISTORY: Social History   Socioeconomic History   Marital status: Married    Spouse name: Marcelino Duster   Number of children: Not on file   Years of education: Not on file   Highest education level: Not on file  Occupational History   Occupation: self employed  Tobacco Use   Smoking status: Never   Smokeless tobacco: Never  Vaping Use   Vaping status: Never Used  Substance and Sexual Activity   Alcohol use: Yes    Comment: wine from time to time   Drug use: No    Sexual activity: Yes  Other Topics Concern   Not on file  Social History Narrative   Married with 3 children.  Wife has signs of dementia.   Rare alcohol.  No cigarettes.  Good diet.   Routinely exercises doing bicycle "spinning "does it 3-4 days a week for up to 45 minutes at a time.   He is self-employed in a [employer] fast streamer.   Social Drivers of Corporate investment banker Strain: Low Risk  (11/06/2022)   Received from [facility]   Overall Financial Resource Strain (CARDIA)    Difficulty of Paying Living Expenses: Not hard at all  Food Insecurity: No Food Insecurity (01/15/2023)   Hunger Vital Sign    Worried About Running Out of Food in the Last Year: Never true    Ran Out of Food in the Last Year: Never true  Transportation Needs: No Transportation  Needs (01/15/2023)   PRAPARE - Administrator, Civil Service (Medical): No    Lack of Transportation (Non-Medical): No  Physical Activity: Not on file  Stress: Not on file  Social Connections: Not on file  Intimate Partner Violence: Not At Risk (01/15/2023)   Humiliation, Afraid, Rape, and Kick questionnaire    Fear of Current or Ex-Partner: No    Emotionally Abused: No    Physically Abused: No    Sexually Abused: No    FAMILY HISTORY: Family History  Problem Relation Age of Onset   Heart failure Mother    Dementia Mother    Kidney disease Sister    Heart attack Cousin 26    ALLERGIES:  has no known allergies.  MEDICATIONS:  Current Outpatient Medications  Medication Sig Dispense Refill   Apple Cider Vinegar 600 MG CAPS Take 600 mg by mouth daily.     Ascorbic Acid (VITAMIN C ADULT GUMMIES PO) Take 2 each by mouth daily.     aspirin 81 MG EC tablet Take 81 mg by mouth in the morning and at bedtime.     benazepril (LOTENSIN) 20 MG tablet Take 20 mg by mouth every morning.  1   Calcium-Phosphorus-Vitamin D (CALCIUM GUMMIES) 250-100-500 MG-MG-UNIT CHEW Chew 1 tablet  by mouth daily.     Coenzyme Q10 (COQ10 GUMMIES ADULT PO) Take 2 each by mouth daily.     hydrochlorothiazide (HYDRODIURIL) 12.5 MG tablet Take 12.5 mg by mouth daily.      MAGNESIUM PO Take 2 tablets by mouth daily.     Multiple Vitamins-Minerals (MULTIVITAMIN ADULT) CHEW Chew 1 each by mouth daily.     Omega-3 Fatty Acids (FISH OIL ADULT GUMMIES PO) Take 2 each by mouth daily.     OVER THE COUNTER MEDICATION Take 1 capsule by mouth daily. Chaga Mushroom Supplement     pravastatin (PRAVACHOL) 20 MG tablet Take 20 mg by mouth at bedtime.     Turmeric 500 MG CAPS Take 1,000 mg by mouth daily.     Zinc Citrate (ZINC GUMMY PO) Take 2 tablets by mouth daily.     acetaminophen (TYLENOL) 500 MG tablet Take 2 tablets (1,000 mg total) by mouth every 8 (eight) hours. (Patient not taking: Reported on 05/12/2023) 30 tablet 0   No current facility-administered medications for this visit.     PHYSICAL EXAMINATION: ECOG PERFORMANCE STATUS: 0 - Asymptomatic Vitals:   05/12/23 1043 05/12/23 1055  BP: (!) 157/70 139/66  Pulse: 74 (!) 50  Resp: 18   Temp: 97.7 F (36.5 C)    Filed Weights   05/12/23 1043  Weight: 243 lb 12.8 oz (110.6 kg)    Physical Exam Constitutional:      General: He is not in acute distress. HENT:     Head: Normocephalic and atraumatic.  Eyes:     General: No scleral icterus.    Pupils: Pupils are equal, round, and reactive to light.  Cardiovascular:     Rate and Rhythm: Normal rate and regular rhythm.  Pulmonary:     Effort: Pulmonary effort is normal. No respiratory distress.     Breath sounds: No wheezing.  Abdominal:     General: Bowel sounds are normal. There is no distension.     Palpations: Abdomen is soft. There is no mass.     Tenderness: There is no abdominal tenderness.  Musculoskeletal:        General: No deformity. Normal range of motion.  Cervical back: Normal range of motion and neck supple.  Skin:    General: Skin is warm and dry.      Findings: No erythema or rash.  Neurological:     Mental Status: He is alert and oriented to person, place, and time. Mental status is at baseline.     Cranial Nerves: No cranial nerve deficit.     Coordination: Coordination normal.  Psychiatric:        Mood and Affect: Mood normal.      LABORATORY DATA:  I have reviewed the data as listed    Latest Ref Rng & Units 05/12/2023   10:21 AM 01/16/2023    6:34 AM 01/07/2023    8:56 AM  CBC  WBC 4.0 - 10.5 K/uL 3.3  9.1  3.0   Hemoglobin 13.0 - 17.0 g/dL 29.5  62.1  30.8   Hematocrit 39.0 - 52.0 % 38.7  32.5  40.4   Platelets 150 - 400 K/uL 167  128  156        RADIOGRAPHIC STUDIES: I have personally reviewed the radiological images as listed and agreed with the findings in the report. No results found.

## 2023-05-18 ENCOUNTER — Encounter
Admission: RE | Disposition: A | Discharge status: Home Health | Payer: Self-pay | Source: Ambulatory Visit | Attending: Orthopedic Surgery

## 2023-05-18 ENCOUNTER — Ambulatory Visit: Payer: Self-pay | Admitting: Urgent Care

## 2023-05-18 ENCOUNTER — Other Ambulatory Visit: Payer: Self-pay

## 2023-05-18 ENCOUNTER — Encounter: Payer: Self-pay | Admitting: Orthopedic Surgery

## 2023-05-18 ENCOUNTER — Ambulatory Visit
Admission: RE | Admit: 2023-05-18 | Discharge: 2023-05-20 | Disposition: A | Discharge status: Home Health | Source: Ambulatory Visit | Attending: Orthopedic Surgery | Admitting: Orthopedic Surgery

## 2023-05-18 ENCOUNTER — Ambulatory Visit

## 2023-05-18 DIAGNOSIS — I129 Hypertensive chronic kidney disease with stage 1 through stage 4 chronic kidney disease, or unspecified chronic kidney disease: Secondary | ICD-10-CM | POA: Insufficient documentation

## 2023-05-18 DIAGNOSIS — N183 Chronic kidney disease, stage 3 unspecified: Secondary | ICD-10-CM | POA: Diagnosis not present

## 2023-05-18 DIAGNOSIS — R42 Dizziness and giddiness: Secondary | ICD-10-CM | POA: Insufficient documentation

## 2023-05-18 DIAGNOSIS — M1611 Unilateral primary osteoarthritis, right hip: Secondary | ICD-10-CM | POA: Diagnosis not present

## 2023-05-18 DIAGNOSIS — Z96641 Presence of right artificial hip joint: Secondary | ICD-10-CM | POA: Diagnosis present

## 2023-05-18 HISTORY — PX: TOTAL HIP ARTHROPLASTY: SHX124

## 2023-05-18 SURGERY — ARTHROPLASTY, HIP, TOTAL, ANTERIOR APPROACH
Anesthesia: General | Site: Hip | Laterality: Right

## 2023-05-18 MED ORDER — FENTANYL CITRATE (PF) 100 MCG/2ML IJ SOLN: 25.0000 ug | INTRAMUSCULAR | Status: DC | PRN

## 2023-05-18 MED ORDER — SEVOFLURANE IN SOLN
RESPIRATORY_TRACT | Status: AC
  Filled 2023-05-18: qty 250

## 2023-05-18 MED ORDER — EPHEDRINE SULFATE-NACL 50-0.9 MG/10ML-% IV SOSY
PREFILLED_SYRINGE | INTRAVENOUS | Status: DC | PRN
  Administered 2023-05-18: 10 mg via INTRAVENOUS
  Administered 2023-05-18: 2.5 mg via INTRAVENOUS
  Administered 2023-05-18: 5 mg via INTRAVENOUS

## 2023-05-18 MED ORDER — PROPOFOL 10 MG/ML IV BOLUS
INTRAVENOUS | Status: DC | PRN
  Administered 2023-05-18: 80 ug/kg/min via INTRAVENOUS

## 2023-05-18 MED ORDER — LIDOCAINE HCL (PF) 2 % IJ SOLN
INTRAMUSCULAR | Status: AC
  Filled 2023-05-18: qty 5

## 2023-05-18 MED ORDER — LACTATED RINGERS IV SOLN: INTRAVENOUS | Status: DC

## 2023-05-18 MED ORDER — ASPIRIN 81 MG PO TBEC
81.0000 mg | DELAYED_RELEASE_TABLET | Freq: Every day | ORAL | Status: DC
  Administered 2023-05-19 – 2023-05-20 (×2): 81 mg via ORAL
  Filled 2023-05-18 (×2): qty 1

## 2023-05-18 MED ORDER — TRANEXAMIC ACID-NACL 1000-0.7 MG/100ML-% IV SOLN
INTRAVENOUS | Status: AC
Start: 2023-05-18 — End: ?
  Filled 2023-05-18: qty 100

## 2023-05-18 MED ORDER — DEXAMETHASONE SODIUM PHOSPHATE 10 MG/ML IJ SOLN
8.0000 mg | Freq: Once | INTRAMUSCULAR | Status: AC
  Administered 2023-05-18: 8 mg via INTRAVENOUS

## 2023-05-18 MED ORDER — CEFAZOLIN SODIUM-DEXTROSE 2-4 GM/100ML-% IV SOLN
INTRAVENOUS | Status: AC
  Filled 2023-05-18: qty 100

## 2023-05-18 MED ORDER — PRAVASTATIN SODIUM 20 MG PO TABS
20.0000 mg | ORAL_TABLET | Freq: Every day | ORAL | Status: DC
  Administered 2023-05-18 – 2023-05-19 (×2): 20 mg via ORAL
  Filled 2023-05-18 (×3): qty 1

## 2023-05-18 MED ORDER — ONDANSETRON HCL 4 MG/2ML IJ SOLN: 4.0000 mg | Freq: Once | INTRAMUSCULAR | Status: DC | PRN

## 2023-05-18 MED ORDER — ALBUMIN HUMAN 5 % IV SOLN: INTRAVENOUS | Status: DC | PRN

## 2023-05-18 MED ORDER — TRANEXAMIC ACID-NACL 1000-0.7 MG/100ML-% IV SOLN
1000.0000 mg | INTRAVENOUS | Status: AC
  Administered 2023-05-18 (×2): 1000 mg via INTRAVENOUS

## 2023-05-18 MED ORDER — ONDANSETRON HCL 4 MG/2ML IJ SOLN
INTRAMUSCULAR | Status: DC | PRN
  Administered 2023-05-18: 4 mg via INTRAVENOUS

## 2023-05-18 MED ORDER — PROPOFOL 1000 MG/100ML IV EMUL
INTRAVENOUS | Status: AC
  Filled 2023-05-18: qty 100

## 2023-05-18 MED ORDER — DOCUSATE SODIUM 100 MG PO CAPS
100.0000 mg | ORAL_CAPSULE | Freq: Two times a day (BID) | ORAL | Status: DC
  Administered 2023-05-18 – 2023-05-20 (×5): 100 mg via ORAL
  Filled 2023-05-18 (×5): qty 1

## 2023-05-18 MED ORDER — ONDANSETRON HCL 4 MG PO TABS: 4.0000 mg | ORAL_TABLET | Freq: Four times a day (QID) | ORAL | Status: DC | PRN

## 2023-05-18 MED ORDER — ACETAMINOPHEN 325 MG PO TABS
325.0000 mg | ORAL_TABLET | Freq: Four times a day (QID) | ORAL | Status: DC | PRN
  Administered 2023-05-19: 650 mg via ORAL
  Filled 2023-05-18: qty 2

## 2023-05-18 MED ORDER — PHENYLEPHRINE 80 MCG/ML (10ML) SYRINGE FOR IV PUSH (FOR BLOOD PRESSURE SUPPORT)
PREFILLED_SYRINGE | INTRAVENOUS | Status: AC
  Filled 2023-05-18: qty 10

## 2023-05-18 MED ORDER — BUPIVACAINE LIPOSOME 1.3 % IJ SUSP
INTRAMUSCULAR | Status: AC
  Filled 2023-05-18: qty 20

## 2023-05-18 MED ORDER — TRAMADOL HCL 50 MG PO TABS: 50.0000 mg | ORAL_TABLET | Freq: Four times a day (QID) | ORAL | Status: DC | PRN

## 2023-05-18 MED ORDER — BUPIVACAINE HCL (PF) 0.5 % IJ SOLN
INTRAMUSCULAR | Status: AC
  Filled 2023-05-18: qty 10

## 2023-05-18 MED ORDER — PHENYLEPHRINE HCL-NACL 20-0.9 MG/250ML-% IV SOLN
INTRAVENOUS | Status: DC | PRN
  Administered 2023-05-18: 40 ug/min via INTRAVENOUS

## 2023-05-18 MED ORDER — METOCLOPRAMIDE HCL 5 MG/ML IJ SOLN: 5.0000 mg | Freq: Three times a day (TID) | INTRAMUSCULAR | Status: DC | PRN

## 2023-05-18 MED ORDER — HYDROCODONE-ACETAMINOPHEN 5-325 MG PO TABS: 1.0000 | ORAL_TABLET | ORAL | Status: DC | PRN

## 2023-05-18 MED ORDER — GLYCOPYRROLATE 0.2 MG/ML IJ SOLN
INTRAMUSCULAR | Status: AC
  Filled 2023-05-18: qty 1

## 2023-05-18 MED ORDER — ALBUMIN HUMAN 5 % IV SOLN
INTRAVENOUS | Status: AC
  Filled 2023-05-18: qty 500

## 2023-05-18 MED ORDER — PROPOFOL 500 MG/50ML IV EMUL
INTRAVENOUS | Status: DC | PRN
Start: 2023-05-18 — End: 2023-05-18
  Administered 2023-05-18: 20 mg via INTRAVENOUS

## 2023-05-18 MED ORDER — EPHEDRINE 5 MG/ML INJ
INTRAVENOUS | Status: AC
  Filled 2023-05-18: qty 5

## 2023-05-18 MED ORDER — HYDROCHLOROTHIAZIDE 25 MG PO TABS
12.5000 mg | ORAL_TABLET | Freq: Every day | ORAL | Status: DC
  Administered 2023-05-18: 12.5 mg via ORAL
  Filled 2023-05-18: qty 1

## 2023-05-18 MED ORDER — CHLORHEXIDINE GLUCONATE 0.12 % MT SOLN
15.0000 mL | Freq: Once | OROMUCOSAL | Status: AC
  Administered 2023-05-18: 15 mL via OROMUCOSAL

## 2023-05-18 MED ORDER — PANTOPRAZOLE SODIUM 40 MG PO TBEC
40.0000 mg | DELAYED_RELEASE_TABLET | Freq: Every day | ORAL | Status: DC
  Administered 2023-05-18 – 2023-05-20 (×3): 40 mg via ORAL
  Filled 2023-05-18 (×3): qty 1

## 2023-05-18 MED ORDER — BUPIVACAINE HCL (PF) 0.5 % IJ SOLN
INTRAMUSCULAR | Status: DC | PRN
  Administered 2023-05-18: 3 mL

## 2023-05-18 MED ORDER — PHENYLEPHRINE 80 MCG/ML (10ML) SYRINGE FOR IV PUSH (FOR BLOOD PRESSURE SUPPORT)
PREFILLED_SYRINGE | INTRAVENOUS | Status: DC | PRN
Start: 2023-05-18 — End: 2023-05-18
  Administered 2023-05-18: 80 ug via INTRAVENOUS
  Administered 2023-05-18 (×3): 160 ug via INTRAVENOUS
  Administered 2023-05-18 (×2): 80 ug via INTRAVENOUS
  Administered 2023-05-18: 160 ug via INTRAVENOUS
  Administered 2023-05-18: 40 ug via INTRAVENOUS
  Administered 2023-05-18 (×2): 80 ug via INTRAVENOUS
  Administered 2023-05-18: 120 ug via INTRAVENOUS
  Administered 2023-05-18: 80 ug via INTRAVENOUS

## 2023-05-18 MED ORDER — 0.9 % SODIUM CHLORIDE (POUR BTL) OPTIME
TOPICAL | Status: DC | PRN
  Administered 2023-05-18: 500 mL

## 2023-05-18 MED ORDER — PHENOL 1.4 % MT LIQD: 1.0000 | OROMUCOSAL | Status: DC | PRN

## 2023-05-18 MED ORDER — CHLORHEXIDINE GLUCONATE 0.12 % MT SOLN
OROMUCOSAL | Status: AC
  Filled 2023-05-18: qty 15

## 2023-05-18 MED ORDER — LIDOCAINE HCL (CARDIAC) PF 50 MG/5ML IV SOSY
PREFILLED_SYRINGE | INTRAVENOUS | Status: DC | PRN
  Administered 2023-05-18: 2 mL via INTRAVENOUS
  Administered 2023-05-18: 1 mL via INTRAVENOUS

## 2023-05-18 MED ORDER — ONDANSETRON HCL 4 MG/2ML IJ SOLN: 4.0000 mg | Freq: Four times a day (QID) | INTRAMUSCULAR | Status: DC | PRN

## 2023-05-18 MED ORDER — CEFAZOLIN SODIUM-DEXTROSE 2-4 GM/100ML-% IV SOLN
2.0000 g | Freq: Four times a day (QID) | INTRAVENOUS | Status: AC
  Administered 2023-05-18 (×2): 2 g via INTRAVENOUS
  Filled 2023-05-18: qty 100

## 2023-05-18 MED ORDER — OXYCODONE HCL 5 MG PO TABS: 5.0000 mg | ORAL_TABLET | Freq: Once | ORAL | Status: DC | PRN

## 2023-05-18 MED ORDER — SODIUM CHLORIDE 0.9 % IR SOLN
Status: DC | PRN
  Administered 2023-05-18: 250 mL

## 2023-05-18 MED ORDER — MENTHOL 3 MG MT LOZG: 1.0000 | LOZENGE | OROMUCOSAL | Status: DC | PRN

## 2023-05-18 MED ORDER — BENAZEPRIL HCL 20 MG PO TABS
20.0000 mg | ORAL_TABLET | ORAL | Status: DC
  Administered 2023-05-18 – 2023-05-20 (×3): 20 mg via ORAL
  Filled 2023-05-18 (×3): qty 1

## 2023-05-18 MED ORDER — MORPHINE SULFATE (PF) 2 MG/ML IV SOLN
0.5000 mg | INTRAVENOUS | Status: DC | PRN
Start: 2023-05-18 — End: 2023-05-20

## 2023-05-18 MED ORDER — GLYCOPYRROLATE 0.2 MG/ML IJ SOLN
INTRAMUSCULAR | Status: DC | PRN
  Administered 2023-05-18: .2 mg via INTRAVENOUS

## 2023-05-18 MED ORDER — ACETAMINOPHEN 10 MG/ML IV SOLN
INTRAVENOUS | Status: DC | PRN
  Administered 2023-05-18: 1000 mg via INTRAVENOUS

## 2023-05-18 MED ORDER — SODIUM CHLORIDE (PF) 0.9 % IJ SOLN
INTRAMUSCULAR | Status: AC
Start: 2023-05-18 — End: ?
  Filled 2023-05-18: qty 10

## 2023-05-18 MED ORDER — MIDAZOLAM HCL 2 MG/2ML IJ SOLN
INTRAMUSCULAR | Status: AC
  Filled 2023-05-18: qty 2

## 2023-05-18 MED ORDER — ACETAMINOPHEN 10 MG/ML IV SOLN: 1000.0000 mg | Freq: Once | INTRAVENOUS | Status: DC | PRN

## 2023-05-18 MED ORDER — ENOXAPARIN SODIUM 40 MG/0.4ML IJ SOSY
40.0000 mg | PREFILLED_SYRINGE | INTRAMUSCULAR | Status: DC
  Administered 2023-05-19 – 2023-05-20 (×2): 40 mg via SUBCUTANEOUS
  Filled 2023-05-18 (×2): qty 0.4

## 2023-05-18 MED ORDER — OXYCODONE HCL 5 MG/5ML PO SOLN: 5.0000 mg | Freq: Once | ORAL | Status: DC | PRN

## 2023-05-18 MED ORDER — PROPOFOL 10 MG/ML IV BOLUS
INTRAVENOUS | Status: AC
  Filled 2023-05-18: qty 20

## 2023-05-18 MED ORDER — CEFAZOLIN SODIUM-DEXTROSE 2-4 GM/100ML-% IV SOLN
2.0000 g | INTRAVENOUS | Status: AC
  Administered 2023-05-18: 2 g via INTRAVENOUS

## 2023-05-18 MED ORDER — SODIUM CHLORIDE 0.9 % IV SOLN: INTRAVENOUS | Status: DC

## 2023-05-18 MED ORDER — BUPIVACAINE-EPINEPHRINE (PF) 0.25% -1:200000 IJ SOLN
INTRAMUSCULAR | Status: AC
  Filled 2023-05-18: qty 30

## 2023-05-18 MED ORDER — ACETAMINOPHEN 500 MG PO TABS
1000.0000 mg | ORAL_TABLET | Freq: Three times a day (TID) | ORAL | Status: AC
  Administered 2023-05-18 – 2023-05-19 (×4): 1000 mg via ORAL
  Filled 2023-05-18 (×4): qty 2

## 2023-05-18 MED ORDER — PHENYLEPHRINE HCL-NACL 20-0.9 MG/250ML-% IV SOLN
INTRAVENOUS | Status: AC
  Filled 2023-05-18: qty 250

## 2023-05-18 MED ORDER — ORAL CARE MOUTH RINSE: 15.0000 mL | Freq: Once | OROMUCOSAL | Status: AC

## 2023-05-18 MED ORDER — SURGIPHOR WOUND IRRIGATION SYSTEM - OPTIME: TOPICAL | Status: DC | PRN

## 2023-05-18 MED ORDER — SODIUM CHLORIDE (PF) 0.9 % IJ SOLN
INTRAMUSCULAR | Status: DC | PRN
  Administered 2023-05-18: 50 mL

## 2023-05-18 MED ORDER — METOCLOPRAMIDE HCL 10 MG PO TABS: 5.0000 mg | ORAL_TABLET | Freq: Three times a day (TID) | ORAL | Status: DC | PRN

## 2023-05-18 MED ORDER — MIDAZOLAM HCL 2 MG/2ML IJ SOLN
INTRAMUSCULAR | Status: DC | PRN
  Administered 2023-05-18: 2 mg via INTRAVENOUS

## 2023-05-18 SURGICAL SUPPLY — 63 items
BLADE CLIPPER SURG (BLADE) IMPLANT
BLADE SAGITTAL AGGR TOOTH XLG (BLADE) ×2 IMPLANT
BNDG COHESIVE 6X5 TAN ST LF (GAUZE/BANDAGES/DRESSINGS) ×2 IMPLANT
BRUSH SCRUB EZ PLAIN DRY (MISCELLANEOUS) ×2 IMPLANT
CHLORAPREP W/TINT 26 (MISCELLANEOUS) ×2 IMPLANT
DERMABOND ADVANCED .7 DNX12 (GAUZE/BANDAGES/DRESSINGS) ×2 IMPLANT
DRAPE C-ARM XRAY 36X54 (DRAPES) ×2 IMPLANT
DRAPE SHEET LG 3/4 BI-LAMINATE (DRAPES) ×6 IMPLANT
DRAPE TABLE BACK 80X90 (DRAPES) ×2 IMPLANT
DRSG MEPILEX SACRM 8.7X9.8 (GAUZE/BANDAGES/DRESSINGS) ×2 IMPLANT
DRSG OPSITE POSTOP 4X8 (GAUZE/BANDAGES/DRESSINGS) ×2 IMPLANT
ELECT BLADE 4.0 EZ CLEAN MEGAD (MISCELLANEOUS) ×1 IMPLANT
ELECT REM PT RETURN 9FT ADLT (ELECTROSURGICAL) ×1 IMPLANT
ELECTRODE BLDE 4.0 EZ CLN MEGD (MISCELLANEOUS) ×2 IMPLANT
ELECTRODE REM PT RTRN 9FT ADLT (ELECTROSURGICAL) ×2 IMPLANT
GLOVE BIO SURGEON STRL SZ8 (GLOVE) ×2 IMPLANT
GLOVE BIOGEL PI IND STRL 8 (GLOVE) ×2 IMPLANT
GLOVE PI ORTHO PRO STRL 7.5 (GLOVE) ×4 IMPLANT
GLOVE PI ORTHO PRO STRL SZ8 (GLOVE) ×4 IMPLANT
GLOVE SURG SYN 7.5 E (GLOVE) ×1 IMPLANT
GLOVE SURG SYN 7.5 PF PI (GLOVE) ×2 IMPLANT
GOWN SRG XL LVL 3 NONREINFORCE (GOWNS) ×2 IMPLANT
GOWN STRL REUS W/ TWL LRG LVL3 (GOWN DISPOSABLE) ×2 IMPLANT
GOWN STRL REUS W/ TWL XL LVL3 (GOWN DISPOSABLE) ×2 IMPLANT
HEAD FEM TAPER UNIV 4 (Orthopedic Implant) IMPLANT
HOOD PEEL AWAY T7 (MISCELLANEOUS) ×4 IMPLANT
INSERT 0D POLY 40 SZ F (Insert) IMPLANT
IV NS 100ML SINGLE PACK (IV SOLUTION) ×2 IMPLANT
KIT PATIENT CARE HANA TABLE (KITS) ×2 IMPLANT
KIT TURNOVER CYSTO (KITS) ×2 IMPLANT
LIGHT WAVEGUIDE WIDE FLAT (MISCELLANEOUS) ×2 IMPLANT
MANIFOLD NEPTUNE II (INSTRUMENTS) ×2 IMPLANT
MARKER SKIN DUAL TIP RULER LAB (MISCELLANEOUS) ×2 IMPLANT
MAT ABSORB FLUID 56X50 GRAY (MISCELLANEOUS) ×2 IMPLANT
NDL SPNL 20GX3.5 QUINCKE YW (NEEDLE) ×2 IMPLANT
NEEDLE SPNL 20GX3.5 QUINCKE YW (NEEDLE) ×1 IMPLANT
NS IRRIG 500ML POUR BTL (IV SOLUTION) ×2 IMPLANT
PACK HIP COMPR (MISCELLANEOUS) ×2 IMPLANT
PAD ARMBOARD POSITIONER FOAM (MISCELLANEOUS) ×2 IMPLANT
PENCIL SMOKE EVACUATOR (MISCELLANEOUS) ×2 IMPLANT
SCREW HEX LP 6.5X20 (Screw) IMPLANT
SCREW HEX LP 6.5X30 (Screw) IMPLANT
SHELL TRIDENT II CLUST SZ 56MM (Shell) IMPLANT
SLEEVE FEM ADAPTER V-40 +0 (Sleeve) IMPLANT
SLEEVE SCD COMPRESS KNEE MED (STOCKING) ×2 IMPLANT
SOLUTION IRRIG SURGIPHOR (IV SOLUTION) ×2 IMPLANT
STEM HIGH OFFSET SZ5X105 HIGH (Stem) IMPLANT
SURGIFLO W/THROMBIN 8M KIT (HEMOSTASIS) IMPLANT
SUT BONE WAX W31G (SUTURE) ×2 IMPLANT
SUT ETHIBOND 2 V 37 (SUTURE) ×2 IMPLANT
SUT SILK 0 30XBRD TIE 6 (SUTURE) ×2 IMPLANT
SUT STRATA 1 CT-1 DLB (SUTURE) ×1 IMPLANT
SUT STRATAFIX 14 PDO 48 VLT (SUTURE) ×2 IMPLANT
SUT STRATAFIX PDO 1 14 VIOLET (SUTURE) ×1 IMPLANT
SUT VIC AB 0 CT1 36 (SUTURE) ×2 IMPLANT
SUT VIC AB 2-0 CT2 27 (SUTURE) ×2 IMPLANT
SUTURE STRATA SPIR 4-0 18 (SUTURE) ×2 IMPLANT
SYR 20ML LL LF (SYRINGE) ×4 IMPLANT
TAPE MICROFOAM 4IN (TAPE) IMPLANT
TOWEL OR 17X26 4PK STRL BLUE (TOWEL DISPOSABLE) IMPLANT
TRAP FLUID SMOKE EVACUATOR (MISCELLANEOUS) ×2 IMPLANT
WAND WEREWOLF FASTSEAL 6.0 (MISCELLANEOUS) ×2 IMPLANT
WATER STERILE IRR 1000ML POUR (IV SOLUTION) ×2 IMPLANT

## 2023-05-18 NOTE — Discharge Instructions (Signed)
 Information for Discharge Teaching:  DO NOT REMOVE TEAL EXPAREL BRACELET FOR 4 days, 96 hours, 05/22/2023 EXPAREL (bupivacaine liposome injectable suspension)   Pain relief is important to your recovery. The goal is to control your pain so you can move easier and return to your normal activities as soon as possible after your procedure. Your physician may use several types of medicines to manage pain, swelling, and more.  Your surgeon or anesthesiologist gave you EXPAREL(bupivacaine) to help control your pain after surgery.  EXPAREL is a local anesthetic designed to release slowly over an extended period of time to provide pain relief by numbing the tissue around the surgical site. EXPAREL is designed to release pain medication over time and can control pain for up to 72 hours. Depending on how you respond to EXPAREL, you may require less pain medication during your recovery. EXPAREL can help reduce or eliminate the need for opioids during the first few days after surgery when pain relief is needed the most. EXPAREL is not an opioid and is not addictive. It does not cause sleepiness or sedation.   Important! A teal colored band has been placed on your arm with the date, time and amount of EXPAREL you have received. Please leave this armband in place for the full 96 hours following administration, and then you may remove the band. If you return to the hospital for any reason within 96 hours following the administration of EXPAREL, the armband provides important information that your health care providers to know, and alerts them that you have received this anesthetic.    Possible side effects of EXPAREL: Temporary loss of sensation or ability to move in the area where medication was injected. Nausea, vomiting, constipation Rarely, numbness and tingling in your mouth or lips, lightheadedness, or anxiety may occur. Call your doctor right away if you think you may be experiencing any of these  sensations, or if you have other questions regarding possible side effects.  Follow all other discharge instructions given to you by your surgeon or nurse. Eat a healthy diet and drink plenty of water or other fluids.               Instructions after Anterior Total Hip Replacement        Dr. Regenia Skeeter., M.D.      Dept. of Orthopaedics & Sports Medicine  [facility]  [address]  [place], [place]  [address]  Phone: [phone]   Fax: [phone]    DIET: Drink plenty of non-alcoholic fluids. Resume your normal diet. Include foods high in fiber.  ACTIVITY:  You may use crutches or a walker with weight-bearing as tolerated, unless instructed otherwise. You may be weaned off of the walker or crutches by your Physical Therapist.  Continue doing gentle exercises. Exercising will reduce the pain and swelling, increase motion, and prevent muscle weakness.   Please continue to use the TED compression stockings for 2 weeks. You may remove the stockings at night, but should reapply them in the morning. Do not drive or operate any equipment until instructed.  WOUND CARE:  Continue to use ice packs periodically to reduce pain and swelling. You may shower with honeycomb dressing 3 days after your surgery. Do not submerge incision site under water. Remove honeycomb dressing 7 days after surgery and allow dermabond to fall off on its own.   MEDICATIONS: You may resume your regular medications. Please take the pain medication as prescribed on the medication list. Do not take  pain medication on an empty stomach. You have been given a prescription for a blood thinner to prevent blood clots. Please take the medication as instructed. (NOTE: After completing a 2 week course of Lovenox, take one Enteric-coated 81 mg aspirin twice a day for 3 additional weeks.) Pain medications and iron supplements can cause constipation. Use a stool softener (Senokot or Colace) on a  daily basis and a laxative (dulcolax or miralax) as needed. Do not drive or drink alcoholic beverages when taking pain medications.  POSTOPERATIVE CONSTIPATION PROTOCOL Constipation - defined medically as fewer than three stools per week and severe constipation as less than one stool per week.  One of the most common issues patients have following surgery is constipation.  Even if you have a regular bowel pattern at home, your normal regimen is likely to be disrupted due to multiple reasons following surgery.  Combination of anesthesia, postoperative narcotics, change in appetite and fluid intake all can affect your bowels.  In order to avoid complications following surgery, here are some recommendations in order to help you during your recovery period.  Colace (docusate) - Pick up an over-the-counter form of Colace or another stool softener and take twice a day as long as you are requiring postoperative pain medications.  Take with a full glass of water daily.  If you experience loose stools or diarrhea, hold the colace until you stool forms back up.  If your symptoms do not get better within 1 week or if they get worse, check with your doctor.  Dulcolax (bisacodyl) - Pick up over-the-counter and take as directed by the product packaging as needed to assist with the movement of your bowels.  Take with a full glass of water.  Use this product as needed if not relieved by Colace only.   MiraLax (polyethylene glycol) - Pick up over-the-counter to have on hand.  MiraLax is a solution that will increase the amount of water in your bowels to assist with bowel movements.  Take as directed and can mix with a glass of water, juice, soda, coffee, or tea.  Take if you go more than two days without a movement. Do not use MiraLax more than once per day. Call your doctor if you are still constipated or irregular after using this medication for 7 days in a row.  If you continue to have problems with postoperative  constipation, please contact the office for further assistance and recommendations.  If you experience "the worst abdominal pain ever" or develop nausea or vomiting, please contact the office immediatly for further recommendations for treatment.   CALL THE OFFICE FOR: Temperature above 101 degrees Excessive bleeding or drainage on the dressing. Excessive swelling, coldness, or paleness of the toes. Persistent nausea and vomiting.  FOLLOW-UP:  You should have an appointment to return to the office in 2 weeks after surgery. Arrangements have been made for continuation of Physical Therapy (either home therapy or outpatient therapy).   [facility] They will call you to set up when they are coming out to see you   [address], [place], [place] [address] Hours:  Open ? Closes 5?PM Phone: [phone]

## 2023-05-18 NOTE — Anesthesia Procedure Notes (Signed)
 Spinal  Patient location during procedure: OR Start time: 05/18/2023 7:29 AM End time: 05/18/2023 7:41 AM Staffing Performed: anesthesiologist  Anesthesiologist: Reed Breech, MD Resident/CRNA: Elisabeth Pigeon, CRNA Other anesthesia staff: Rosine Abe, RN Performed by: Reed Breech, MD Authorized by: Reed Breech, MD   Preanesthetic Checklist Completed: patient identified, IV checked, site marked, risks and benefits discussed, surgical consent, monitors and equipment checked, pre-op evaluation and timeout performed Spinal Block Patient position: sitting Prep: ChloraPrep Patient monitoring: continuous pulse ox, heart rate and blood pressure Approach: midline Location: L3-4 Injection technique: single-shot Needle Needle type: Pencil-Tip  Needle gauge: 25 G Assessment Sensory level: T10 Events: CSF return

## 2023-05-18 NOTE — Anesthesia Procedure Notes (Addendum)
 Spinal  Patient location during procedure: OR Start time: 05/18/2023 7:32 AM End time: 05/18/2023 7:41 AM Reason for block: surgical anesthesia Staffing Performed: anesthesiologist  Anesthesiologist: Reed Breech, MD Other anesthesia staff: Rosine Abe, RN Performed by: Reed Breech, MD Authorized by: Reed Breech, MD   Preanesthetic Checklist Completed: patient identified, IV checked, site marked, risks and benefits discussed, surgical consent, monitors and equipment checked, pre-op evaluation and timeout performed Spinal Block Patient position: sitting Prep: ChloraPrep Patient monitoring: heart rate, cardiac monitor, continuous pulse ox and blood pressure Approach: midline Location: L2-3 Injection technique: single-shot Needle Needle type: Sprotte  Needle gauge: 24 G Needle length: 9 cm Assessment Sensory level: T4 Events: CSF return and second provider Additional Notes Initial attempt at L3-4 by SRNA Goins with os.  Second attempt at L2-3 by [facility] successful.

## 2023-05-18 NOTE — Anesthesia Preprocedure Evaluation (Addendum)
 Anesthesia Evaluation  Patient identified by MRN, date of birth, ID band Patient awake    Reviewed: Allergy & Precautions, NPO status , Patient's Chart, lab work & pertinent test results  History of Anesthesia Complications Negative for: history of anesthetic complications  Airway Mallampati: III   Neck ROM: Full    Dental  (+) Loose   Pulmonary neg pulmonary ROS   Pulmonary exam normal breath sounds clear to auscultation       Cardiovascular hypertension, Normal cardiovascular exam Rhythm:Regular Rate:Normal  ECG 05/06/23:  Sinus rhythm with marked sinus arrhythmia with occasional Premature ventricular complexes Anteroseptal infarct , age undetermined   Neuro/Psych negative neurological ROS     GI/Hepatic negative GI ROS,,,  Endo/Other  negative endocrine ROS    Renal/GU Renal disease (stage III CKD)     Musculoskeletal  (+) Arthritis ,    Abdominal   Peds  Hematology negative hematology ROS (+)   Anesthesia Other Findings   Reproductive/Obstetrics                             Anesthesia Physical Anesthesia Plan  ASA: 2  Anesthesia Plan: General and Spinal   Post-op Pain Management:    Induction: Intravenous  PONV Risk Score and Plan: 2 and Propofol infusion, TIVA, Treatment may vary due to age or medical condition and Ondansetron  Airway Management Planned: Natural Airway and Nasal Cannula  Additional Equipment:   Intra-op Plan:   Post-operative Plan:   Informed Consent: I have reviewed the patients History and Physical, chart, labs and discussed the procedure including the risks, benefits and alternatives for the proposed anesthesia with the patient or authorized representative who has indicated his/her understanding and acceptance.       Plan Discussed with: CRNA  Anesthesia Plan Comments: (Plan for spinal and GA with natural airway, LMA/GETA backup.  Patient  consented for risks of anesthesia including but not limited to:  - adverse reactions to medications - damage to eyes, teeth, lips or other oral mucosa - nerve damage due to positioning  - sore throat or hoarseness - headache, bleeding, infection, nerve damage 2/2 spinal - damage to heart, brain, nerves, lungs, other parts of body or loss of life  Informed patient about role of CRNA in peri- and intra-operative care.  Patient voiced understanding.)        Anesthesia Quick Evaluation

## 2023-05-18 NOTE — Interval H&P Note (Signed)
 Patient history and physical updated. Consent reviewed including risks, benefits, and alternatives to surgery. Patient agrees with above plan to proceed with right anterior total hip arthroplasty.

## 2023-05-18 NOTE — Anesthesia Postprocedure Evaluation (Signed)
 Anesthesia Post Note  Patient: Stephen Schmidt  Procedure(s) Performed: ARTHROPLASTY, HIP, TOTAL, ANTERIOR APPROACH (Right: Hip)  Patient location during evaluation: PACU Anesthesia Type: Spinal Level of consciousness: oriented and awake and alert Pain management: pain level controlled Vital Signs Assessment: post-procedure vital signs reviewed and stable Respiratory status: spontaneous breathing, respiratory function stable and patient connected to nasal cannula oxygen Cardiovascular status: blood pressure returned to baseline and stable Postop Assessment: no headache, no backache, no apparent nausea or vomiting and spinal receding Anesthetic complications: no   No notable events documented.   Last Vitals:  Vitals:   05/18/23 1100 05/18/23 1120  BP: (!) 121/51 (!) 118/54  Pulse: 77 70  Resp: 15 14  Temp: (!) 36.3 C (!) 36.3 C  SpO2: 99% 98%    Last Pain:  Vitals:   05/18/23 1120  TempSrc: Temporal  PainSc: 0-No pain                 Reed Breech

## 2023-05-18 NOTE — Op Note (Signed)
 Patient Name: Malcome Ambrocio  ZOX:[id number]  Pre-Operative Diagnosis: Right hip Osteoarthritis  Post-Operative Diagnosis: (same)  Procedure: Right Total Hip Arthroplasty  Components/Implants: Cup: Trident Tritanium Clusterhole 56/F w/x2 screws    Liner: Neutral X3 poly 40/F  Stem: Insignia #5 high offset  Head:Biolox ceramic 40 with Taper sleeve +44mm adapter  Date of Surgery: 05/18/2023  Surgeon: Reinaldo Berber MD  Assistant: Amador Cunas PA (present and scrubbed throughout the case, critical for assistance with exposure, retraction, instrumentation, and closure), [place] PAs   Anesthesiologist: Mazzoni  Anesthesia: Spinal   EBL: 300cc  IVF:600cc  Complications: None   Brief history: The patient is a 73 year old male with a history of osteoarthritis of the right hip with pain limiting their range of motion and activities of daily living, which has failed multiple attempts at conservative therapy.  The risks and benefits of total hip arthroplasty as definitive surgical treatment were discussed with the patient, who opted to proceed with the operation.  After outpatient medical clearance and optimization was completed the patient was admitted to [facility] for the procedure.  All preoperative films were reviewed and an appropriate surgical plan was made prior to surgery.   Description of procedure: The patient was brought to the operating room where laterality was confirmed by all those present to be the right side.  The patient was administered spinal anesthesia on a stretcher prior to being moved supine on the operating room table. Patient was given an intravenous dose of antibiotics for surgical prophylaxis and TXA.  All bony prominences and extremities were well padded and the patient was securely attached to the table boots, a perineal post was placed and the patient had a safety strap placed.  Surgical site was prepped with alcohol and chlorhexidine. The  surgical site over the hip was and draped in typical sterile fashion with multiple layers of adhesive and nonadhesive drapes.  The incision site was marked out with a sterile marker and care was taken to assess the position of the ASIS and ensure appropriate position for the incision.    A surgical timeout was then called with participation of all staff in the room the patient was then a confirmed again and laterality confirmed.  Incision was made over the anterior lateral aspect of the proximal thigh in line with the TFL.  Appropriate retractors were placed and all bleeding vessels were coagulated within the subcutaneous and fatty layers.  An incision was made in the TFL fascia in the interval was carefully identified.  The lateral ascending branches of the circumflex vessels were identified, cauterized and carefully dissected. The main vessels were then tied with a 0 silk hand tie.  Retractors were placed around the superior lateral and inferior medial aspects of the femoral neck and a capsulotomy was performed exposing the hip joint.  Retraction stitches were placed and the capsulotomy to assist with visualization.  Femoral neck cut was then made and the femoral head was extracted after placing the leg in traction.  Bone wax was then applied to the proximal cut surface of the femur and aqua mantis was used to address any bleeding around the femoral neck cut.  Retractors were then placed around the acetabulum to fully visualize the joint space, and the remaining labral tissue was removed and pulvinar was removed.   The acetabulum was then sequentially reamed up to the appropriate size in order to get good fit and fill for the acetabular component while under fluoroscopic guidance.  Acetabular component was  then placed and malleted into a secure fit while confirming position and abduction angle and anteversion utilizing fluoroscopy.  2 screws were then placed in the acetabular cup to assist in securing the cup  in place. The cup was irrigated,  a real neutral liner was placed, impacted, and checked for stability. The femur traction was dropped and sequentially externally rotated while performing a release of the posterior and superomedial tissues off of the proximal femur to allow for mobility, care was taken to preserve the external rotators and piriformis attachments.  The remaining interval between the abductors and the capsule was dissected out and a retractor was placed over the superolateral aspect of the femur over the greater trochanter.  The leg was carefully brought down into extension and adducted to provide visualization of the proximal femur for broaching.  The femur was then sequentially broached up to an appropriate size which provided for good fill and stability to the femoral broach.  A trial neck and head were placed on the femoral broach and the leg was brought up for reduction.  The hip was reduced and manual check of stability was performed.  The hip was found to be stable in flexion internal rotation and extension external rotation.  Leg lengths were confirmed on fluoroscopy.   The hip was then dislocated the trial neck and head were removed.  The leg was then brought down into extension and adduction in the proximal femur was reexposed.  The broach trial was removed and the femur was irrigated with normal saline prior to the real femoral stem being implanted.  After the femoral stem was seated and shown to have good fit and fill the appropriate head was impacted the leg was brought up and reduced.  There was good range of motion with stability in flexion internal rotation and extension external rotation on testing.  Leg lengths were found to be appropriate on fluoroscopic evaluation at this time.  The hip was then irrigated with betdine based surgiphor solution and then saline solution.  The capsulotomy was repaired with Ethibond sutures.  A pericapsular and peritrochanteric cocktail with Exparel  and bupivacaine was then injected as well as the subcutaneous tissues. The fascia was closed with a #1 barbed running suture.  The deep tissues were closed with Vicryl sutures the subcutaneous tissues were closed with interrupted Vicryl sutures and a running barbed 4-0 suture.  The skin was then reinforced with Dermabond and a sterile dressing was placed.   The patient was awoken from anesthesia transferred off of the operating room table onto a hospital bed where examination of leg lengths found the leg lengths to be equal with a good distal pulse.  The patient was then transferred to the PACU in stable condition.

## 2023-05-18 NOTE — Transfer of Care (Signed)
 Immediate Anesthesia Transfer of Care Note  Patient: Olumide C Choyce  Procedure(s) Performed: ARTHROPLASTY, HIP, TOTAL, ANTERIOR APPROACH (Right: Hip)  Patient Location: PACU  Anesthesia Type:General and Spinal  Level of Consciousness: drowsy  Airway & Oxygen Therapy: Patient Spontanous Breathing and Patient connected to face mask oxygen  Post-op Assessment: Report given to RN and Post -op Vital signs reviewed and stable  Post vital signs: Reviewed and stable  Last Vitals:  Vitals Value Taken Time  BP 117/66 05/18/23 1017  Temp 36.1 C 05/18/23 1017  Pulse 25 05/18/23 1019  Resp 23 05/18/23 1019  SpO2 100 % 05/18/23 1019  Vitals shown include unfiled device data.  Last Pain:  Vitals:   05/18/23 0637  TempSrc: Temporal  PainSc: 0-No pain      Patients Stated Pain Goal: 0 (05/18/23 1610)  Complications: No notable events documented.

## 2023-05-18 NOTE — Evaluation (Signed)
 Physical Therapy Evaluation Patient Details Name: Stephen Schmidt MRN: [id number] DOB: 10/05/1950 Today's Date: 05/18/2023  History of Present Illness  Pt is a 73 yo male s/p R THA. PMH of HTN, L THA.  Clinical Impression  Patient is alert, agreeable to PT, oriented x4, denied pain. Pt stated at baseline he lives alone but will be staying with his son at discharge. ModI to come to sitting EOB, good sitting balance. Sit <> stand with RW and minA for steadying, extra time to come fully up into standing. He ambulated ~84ft with RW, CGA-supervision. Exhibited decreased gait velocity, decreased step length with flexed trunk, some improvement with verbal cues. Returned to room and in recliner with needs in reach.  Overall the patient demonstrated deficits (see "PT Problem List") that impede the patient's functional abilities, safety, and mobility and would benefit from skilled PT intervention.        If plan is discharge home, recommend the following: A little help with walking and/or transfers;Assistance with cooking/housework;A little help with bathing/dressing/bathroom;Assist for transportation;Help with stairs or ramp for entrance   Can travel by private vehicle        Equipment Recommendations None recommended by PT  Recommendations for Other Services       Functional Status Assessment Patient has had a recent decline in their functional status and demonstrates the ability to make significant improvements in function in a reasonable and predictable amount of time.     Precautions / Restrictions Precautions Precautions: Fall Recall of Precautions/Restrictions: Intact Restrictions Weight Bearing Restrictions Per Provider Order: Yes RLE Weight Bearing Per Provider Order: Weight bearing as tolerated      Mobility  Bed Mobility Overal bed mobility: Modified Independent                  Transfers Overall transfer level: Needs assistance Equipment used: Rolling walker (2  wheels) Transfers: Sit to/from Stand Sit to Stand: Min assist           General transfer comment: minA for steadying    Ambulation/Gait Ambulation/Gait assistance: Contact guard assist Gait Distance (Feet): 60 Feet Assistive device: Rolling walker (2 wheels)         General Gait Details: decreased velocity, cued for upright posture, increased step length with time  Stairs            Wheelchair Mobility     Tilt Bed    Modified Rankin (Stroke Patients Only)       Balance Overall balance assessment: Needs assistance Sitting-balance support: Feet supported Sitting balance-Leahy Scale: Good     Standing balance support: Bilateral upper extremity supported Standing balance-Leahy Scale: Fair                               Pertinent Vitals/Pain Pain Assessment Pain Assessment: No/denies pain    Home Living Family/patient expects to be discharged to:: Private residence Living Arrangements: Spouse/significant other Available Help at Discharge: Family Type of Home: House Home Access: Level entry;Ramped entrance       Home Layout: One level Home Equipment: Cane - Programmer, applications (2 wheels);BSC/3in1      Prior Function Prior Level of Function : Independent/Modified Independent             Mobility Comments: using QC       Extremity/Trunk Assessment   Upper Extremity Assessment Upper Extremity Assessment: Overall WFL for tasks assessed    Lower Extremity Assessment Lower Extremity  Assessment:  (able to lift BLE off of bed without assistance, slowly)       Communication        Cognition Arousal: Alert Behavior During Therapy: WFL for tasks assessed/performed   PT - Cognitive impairments: No apparent impairments                                 Cueing       General Comments      Exercises     Assessment/Plan    PT Assessment Patient needs continued PT services  PT Problem List Decreased  strength;Pain;Decreased range of motion;Decreased activity tolerance;Decreased balance;Decreased mobility;Decreased knowledge of precautions;Decreased knowledge of use of DME       PT Treatment Interventions DME instruction;Balance training;Gait training;Neuromuscular re-education;Stair training;Functional mobility training;Patient/family education;Therapeutic activities;Therapeutic exercise    PT Goals (Current goals can be found in the Care Plan section)  Acute Rehab PT Goals Patient Stated Goal: to go home PT Goal Formulation: With patient Time For Goal Achievement: 06/01/23 Potential to Achieve Goals: Good    Frequency BID     Co-evaluation               AM-PAC PT "6 Clicks" Mobility  Outcome Measure Help needed turning from your back to your side while in a flat bed without using bedrails?: None Help needed moving from lying on your back to sitting on the side of a flat bed without using bedrails?: None Help needed moving to and from a bed to a chair (including a wheelchair)?: None Help needed standing up from a chair using your arms (e.g., wheelchair or bedside chair)?: A Little Help needed to walk in hospital room?: A Little Help needed climbing 3-5 steps with a railing? : A Little 6 Click Score: 21    End of Session   Activity Tolerance: Patient tolerated treatment well Patient left: in chair;with call bell/phone within reach Nurse Communication: Mobility status PT Visit Diagnosis: Other abnormalities of gait and mobility (R26.89);Difficulty in walking, not elsewhere classified (R26.2);Muscle weakness (generalized) (M62.81);Pain Pain - Right/Left: Right Pain - part of body: Hip    Time: 1430-1447 PT Time Calculation (min) (ACUTE ONLY): 17 min   Charges:   PT Evaluation $PT Eval Low Complexity: 1 Low PT Treatments $Therapeutic Activity: 8-22 mins PT General Charges $$ ACUTE PT VISIT: 1 Visit       Olga Coaster PT, DPT 3:48 PM,05/18/23

## 2023-05-18 NOTE — H&P (Signed)
 History of Present Illness: Stephen Schmidt is an 73 y.o. male presents for evaluation of his right hip. Patient recently had his left hip replaced and is doing really well on the left side and now his right hip is his bigger limiting factor causing pain and catching limiting his ability to walk and continue with his recovery for his left hip. He reports his right hip limits him on a daily basis and is affecting his activities of daily living. He is here to discuss getting his right hip replaced in order to further progress with his recovery the patient denies fevers, chills, numbness, tingling, shortness of breath, chest pain, recent illness, or any trauma.  Patient is a non-smoker with a BMI of 28.8 and is a nondiabetic  Past Medical History: Past Medical History:  Diagnosis Date  Hypertension  Pure hypercholesterolemia   Past Surgical History: Past Surgical History:  Procedure Laterality Date  SHOULDER ARTHROSCOPY DISTAL CLAVICLE EXCISION AND OPEN ROTATOR CUFF REPAIR Right 02/15/2018  MINI-OPEN ROTATOR CUFF REPAIR (SUPRASPINATUS AND INFRASPINATUS) OPEN BICEPS TENODESIS, RIGHT ARTHROSCOPIC EXTENSIVE DEBRIDMENT OF SHOULDER (GLENOHUMERAL AND SUBACROMIAL SPACES) SUBACROMIAL DECOMPRESSION- Dr. Allena Schmidt  ARTHROPLASTY HIP TOTAL Right 01/15/2023  Anterior by Dr. Audelia Schmidt  TONSILLECTOMY   Past Family History: Family History  Problem Relation Age of Onset  Heart failure Mother  Kidney disease Sister   Medications: Current Outpatient Medications  Medication Sig Dispense Refill  acetaminophen (TYLENOL) 500 MG tablet Take 2 tablets (1,000 mg total) by mouth every 8 (eight) hours  apple cider vinegar 600 mg Cap Take by mouth once daily  ascorbic acid, vitamin C, 500 mg Chew Take 1 tablet by mouth once daily  aspirin 81 MG EC tablet Take 81 mg by mouth once daily.  aspirin 81 MG EC tablet Take 1 tablet (81 mg total) by mouth 2 (two) times daily for 21 days 42 tablet 0  benazepriL (LOTENSIN) 20  MG tablet Take 1 tablet by mouth once daily 100 tablet 1  celecoxib (CELEBREX) 200 MG capsule Take 1 capsule (200 mg total) by mouth 2 (two) times daily 60 capsule 3  cyanocobalamin (VITAMIN B12) 500 MCG tablet Take 500 mcg by mouth once daily  Herbal Supplement Turmeric with BioPerine 650mg - 1 tablet PO QD  Herbal Supplement Mega Vita-Min- 1 tablet PO QD  Herbal Supplement [place] Cellular Immunity supplement 1000mg - 1 tablet PO QD  hydroCHLOROthiazide (HYDRODIURIL) 12.5 MG tablet Take 1 tablet by mouth once daily 100 tablet 1  omega-3 fatty acids-fish oil 360-1,200 mg Cap Take 1 capsule by mouth 2 (two) times daily.  pravastatin (PRAVACHOL) 20 MG tablet Take 1 tablet by mouth once daily 100 tablet 1  traMADoL (ULTRAM) 50 mg tablet Take 1 tablet (50 mg total) by mouth every 6 (six) hours as needed for Pain 20 tablet 0  turmeric root extract 500 mg Cap Take by mouth once daily   No current facility-administered medications for this visit.   Allergies: No Known Allergies   Visit Vitals: Vitals:  04/28/23 1133  BP: 114/70    Review of Systems:  A comprehensive 14 point ROS was performed, reviewed, and the pertinent orthopaedic findings are documented in the HPI.  Physical Exam: Body mass index is 28.82 kg/m. General/Constitutional: No apparent distress: well-nourished and well developed. Lymphatic: No palpable adenopathy. Pulmonary exam: Lungs clear to auscultation bilaterally no wheezing rales or rhonchi Cardiac exam: Regular rate and rhythm no obvious murmurs rubs or gallops. Vascular: No edema, swelling or tenderness, except as noted in detailed  exam. Integumentary: No impressive skin lesions present, except as noted in detailed exam. Neuro/Psych: Normal mood and affect, oriented to person, place and time. Musculoskeletal: Normal, except as noted in detailed exam and in HPI.  Right hip exam  SKIN: intact SWELLING: none WARMTH: no warmth TENDERNESS: none, Stinchfield  Positive ROM: 0 degrees internal rotation and 30 degrees external rotation and pain with internal rotation,; Hip Flexion 90 STRENGTH: normal GAIT: stiff-legged STABILITY: stable to testing CREPITUS: no LEG LENGTH DISCREPANCY: left longer by .2 cm NEUROLOGICAL EXAM: normal VASCULAR EXAM: normal LUMBAR SPINE: tenderness: no straight leg raising sign: no motor exam: normal  The contralateral hip was examined for comparison and it showed: TENDERNESS: none ROM: normal and full STRENGTH: normal STABILITY: stable to testing  Hip Imaging :  I have reviewed AP pelvis and lateral hip X-rays (2 views) taken today in the office of the right hip which reveal severe degenerative changes with joint space narrowing, osteophyte formation, femoral head deformity and some cystic changes unchanged from prior films. The left hip is status post total hip replacement components in appropriate. Position evidence of periprosthetic fracture or loosening.   Assessment:  Encounter Diagnosis  Name Primary?  Right hip pain Yes  Right hip osteoarthritis  Plan: Stephen Schmidt is a 73 year old male presents with right hip bone on bone arthritis. Based upon the patient's continued symptoms and failure to respond to conservative treatment, I have recommended a right total hip replacement for this patient. A long discussion took place with the patient describing what a total joint replacement is and what the procedure would entail. A hip model, similar to the implants that will be used during the operation, was utilized to demonstrate the implants. Choices of implant manufactures were discussed and reviewed. The ability to secure the implant utilizing cement or cementless (press fit) fixation was discussed. Anterior and posterior exposures were discussed. For this patient an appropriate approach will be anterior.   The hospitalization and post-operative care and rehabilitation were also discussed. The use of perioperative  antibiotics and DVT prophylaxis were discussed. The risk, benefits and alternatives to a surgical intervention were discussed at length with the patient. The patient was also advised of risks related to the medical comorbidities and elevated body mass index (BMI). A lengthy discussion took place to review the most common complications including but not limited to: deep vein thrombosis, pulmonary embolus, heart attack, stroke, infection, wound breakdown, heterotopic ossification, dislocation, numbness, leg length in-equality, intraoperative fracture, damage to nerves, tendon,muscles, arteries or other blood vessels, death and other possible complications from anesthesia. The patient was told that we will take steps to minimize these risks by using sterile technique, antibiotics and DVT prophylaxis when appropriate and follow the patient postoperatively in the office setting to monitor progress. The possibility of recurrent pain, no improvement in pain and actual worsening of pain were also discussed with the patient. The risk of dislocation following total hip replacement was discussed and potential precautions to prevent dislocation were reviewed.   Patient asked about and confirms no history of any reactions to metal or metal allergy in the past.  The discharge plan of care focused on the patient going home following surgery. The patient was encouraged to make the necessary arrangements to have someone stay with them when they are discharged home.   The benefits of surgery were discussed with the patient including the potential for improving the patient's current clinical condition through operative intervention. Alternatives to surgical intervention including continued conservative management were  also discussed in detail. All questions were answered to the satisfaction of the patient. The patient participated and agreed to the plan of care as well as the use of the recommended implants for their total hip  replacement surgery. An information packet was given to the patient to review prior to surgery.   The patient has medical clearance for hip replacement surgery. All questions answered agrees with plan to make preparations for right total hip replacement.  Portions of this record have been created using Scientist, clinical (histocompatibility and immunogenetics). Dictation errors have been sought, but may not have been identified and corrected.  Reinaldo Berber MD

## 2023-05-19 ENCOUNTER — Encounter: Payer: Self-pay | Admitting: Orthopedic Surgery

## 2023-05-19 DIAGNOSIS — M1611 Unilateral primary osteoarthritis, right hip: Secondary | ICD-10-CM | POA: Diagnosis not present

## 2023-05-19 LAB — CBC
HCT: 31.6 % — ABNORMAL LOW (ref 39.0–52.0)
Hemoglobin: 11 g/dL — ABNORMAL LOW (ref 13.0–17.0)
MCH: 28.3 pg (ref 26.0–34.0)
MCHC: 34.8 g/dL (ref 30.0–36.0)
MCV: 81.2 fL (ref 80.0–100.0)
Platelets: 124 10*3/uL — ABNORMAL LOW (ref 150–400)
RBC: 3.89 MIL/uL — ABNORMAL LOW (ref 4.22–5.81)
RDW: 14.6 % (ref 11.5–15.5)
WBC: 8.8 10*3/uL (ref 4.0–10.5)
nRBC: 0 % (ref 0.0–0.2)

## 2023-05-19 LAB — BASIC METABOLIC PANEL
Anion gap: 6 (ref 5–15)
BUN: 26 mg/dL — ABNORMAL HIGH (ref 8–23)
CO2: 28 mmol/L (ref 22–32)
Calcium: 9 mg/dL (ref 8.9–10.3)
Chloride: 104 mmol/L (ref 98–111)
Creatinine, Ser: 1.13 mg/dL (ref 0.61–1.24)
GFR, Estimated: >60 mL/min (ref >60)
Glucose, Bld: 137 mg/dL — ABNORMAL HIGH (ref 70–99)
Potassium: 4.2 mmol/L (ref 3.5–5.1)
Sodium: 138 mmol/L (ref 135–145)

## 2023-05-19 MED ORDER — TRAMADOL HCL 50 MG PO TABS: 50.0000 mg | ORAL_TABLET | Freq: Four times a day (QID) | ORAL | 0 refills | Status: AC | PRN

## 2023-05-19 MED ORDER — CELECOXIB 100 MG PO CAPS
100.0000 mg | ORAL_CAPSULE | Freq: Two times a day (BID) | ORAL | 0 refills | Status: AC
Start: 2023-05-19 — End: 2023-05-29

## 2023-05-19 MED ORDER — ONDANSETRON HCL 4 MG PO TABS
4.0000 mg | ORAL_TABLET | Freq: Four times a day (QID) | ORAL | 0 refills | Status: AC | PRN
Start: 2023-05-19 — End: ?

## 2023-05-19 MED ORDER — ENOXAPARIN SODIUM 40 MG/0.4ML IJ SOSY
40.0000 mg | PREFILLED_SYRINGE | INTRAMUSCULAR | 0 refills | Status: AC
Start: 2023-05-19 — End: 2023-06-02

## 2023-05-19 MED ORDER — SODIUM CHLORIDE 0.9 % IV BOLUS
500.0000 mL | Freq: Once | INTRAVENOUS | Status: AC
  Administered 2023-05-19: 500 mL via INTRAVENOUS

## 2023-05-19 MED ORDER — HYDROCHLOROTHIAZIDE 25 MG PO TABS
12.5000 mg | ORAL_TABLET | Freq: Every day | ORAL | Status: DC
  Administered 2023-05-20: 12.5 mg via ORAL
  Filled 2023-05-19: qty 1

## 2023-05-19 MED ORDER — ACETAMINOPHEN 500 MG PO TABS
1000.0000 mg | ORAL_TABLET | Freq: Three times a day (TID) | ORAL | 0 refills | Status: AC
Start: 2023-05-19 — End: ?

## 2023-05-19 MED ORDER — DOCUSATE SODIUM 100 MG PO CAPS
100.0000 mg | ORAL_CAPSULE | Freq: Two times a day (BID) | ORAL | 0 refills | Status: AC
Start: 2023-05-19 — End: ?

## 2023-05-19 NOTE — Discharge Summary (Signed)
 Physician Discharge Summary  Patient ID: Stephen Schmidt MRN: [id number] DOB/AGE: September 28, 1950 73 y.o.  Admit date: 05/18/2023 Discharge date: 05/20/2023  Admission Diagnoses:  Primary osteoarthritis of right hip [M16.11] Right hip pain [M25.551] S/P total right hip arthroplasty [Z96.641]   Discharge Diagnoses: Patient Active Problem List   Diagnosis Date Noted   S/P total right hip arthroplasty 05/18/2023   S/P total left hip arthroplasty 01/15/2023   Leukopenia 04/18/2022   Thrombocytopenia (HCC) 04/18/2022   Stage 3a chronic kidney disease (HCC) 04/18/2022   Obesity (BMI 30-39.9) 10/28/2012    Class: Diagnosis of   Essential hypertension 10/28/2012   Edema of both legs - with heaviness, swelling and cramping 10/28/2012   Hyperlipidemia    Impotence of organic origin 10/20/2012   Hypertonicity of bladder 10/20/2012   Sleep related leg cramps 07/02/2012    Past Medical History:  Diagnosis Date   CKD (chronic kidney disease) stage 3, GFR 30-59 ml/min (HCC)    DDD (degenerative disc disease), lumbosacral    Edema of both legs    with heaviness, swelling, and cramping   Hyperlipidemia    Hypertension    Hypertonicity of bladder    Leukopenia    Obesity, Class I, BMI 30.0-34.9 (see actual BMI)    However notably lighter used to be.  Once 324 pounds in 1996   Osteoarthritis of both hips    Thrombocytopenia (HCC)      Transfusion: none   Consultants (if any):   Discharged Condition: Improved  Hospital Course: Stephen Schmidt is an 73 y.o. male who was admitted 05/18/2023 with a diagnosis of S/P total right hip arthroplasty and went to the operating room on 05/18/2023 and underwent the above named procedures.    Surgeries: Procedure(s): ARTHROPLASTY, HIP, TOTAL, ANTERIOR APPROACH on 05/18/2023 Patient tolerated the surgery well. Taken to PACU where she was stabilized and then transferred to the orthopedic floor.  Started on Lovenox 40 mg q 24 hrs. TEDs and SCDs applied  bilaterally. Heels elevated on bed. No evidence of DVT. Negative Homan. Physical therapy started on day #1 for gait training and transfer. OT started day #1 for ADL and assisted devices.  On postop day 1, patient noted to be hypotensive.  He became dizzy and lightheaded with standing during physical therapy.  He was given IV fluids and blood pressure medications were held.  Patient was able to successfully complete his afternoon PT goals on postop day 1, was asymptomatic during PT but continued to feel little dizzy and lightheaded at times.   On postop day 2 blood pressure normal.  He was asymptomatic denied any dizziness or lightheadedness.  Was able to successfully complete physical therapy.  On post op day #2 patient was stable and ready for discharge to home with HHPT.  Implants:  Cup: Trident Tritanium Clusterhole 56/F w/x2 screws    Liner: Neutral X3 poly 40/F  Stem: Insignia #5 high offset  Head:Biolox ceramic 40 with Taper sleeve +48mm adapter   He was given perioperative antibiotics:  Anti-infectives (From admission, onward)    Start     Dose/Rate Route Frequency Ordered Stop   05/18/23 1330  ceFAZolin (ANCEF) IVPB 2g/100 mL premix        2 g 200 mL/hr over 30 Minutes Intravenous Every 6 hours 05/18/23 1113 05/18/23 1947   05/18/23 0600  ceFAZolin (ANCEF) IVPB 2g/100 mL premix        2 g 200 mL/hr over 30 Minutes Intravenous On call to O.R. 05/18/23 4098  05/18/23 0748     .  He was given sequential compression devices, early ambulation, and Lovenox TEDs for DVT prophylaxis.  He benefited maximally from the hospital stay and there were no complications.    Recent vital signs:  Vitals:   05/20/23 0411 05/20/23 0805  BP: (!) 135/59 (!) 147/66  Pulse: 89 92  Resp: 18 17  Temp: 99.7 F (37.6 C) 98.1 F (36.7 C)  SpO2: 98% 98%    Recent laboratory studies:  Lab Results  Component Value Date   HGB 10.2 (L) 05/20/2023   HGB 11.0 (L) 05/19/2023   HGB 12.8 (L) 05/12/2023    Lab Results  Component Value Date   WBC 6.5 05/20/2023   PLT 116 (L) 05/20/2023   No results found for: "INR" Lab Results  Component Value Date   NA 138 05/19/2023   K 4.2 05/19/2023   CL 104 05/19/2023   CO2 28 05/19/2023   BUN 26 (H) 05/19/2023   CREATININE 1.13 05/19/2023   GLUCOSE 137 (H) 05/19/2023    Discharge Medications:   Allergies as of 05/20/2023   No Known Allergies      Medication List     STOP taking these medications    aspirin EC 81 MG tablet       TAKE these medications    acetaminophen 500 MG tablet Commonly known as: TYLENOL Take 2 tablets (1,000 mg total) by mouth every 8 (eight) hours.   Apple Cider Vinegar 600 MG Caps Take 600 mg by mouth daily.   benazepril 20 MG tablet Commonly known as: LOTENSIN Take 20 mg by mouth every morning.   Calcium Gummies 250-100-500 MG-UNIT Chew Generic drug: Calcium-Phosphorus-Vitamin D Chew 1 tablet by mouth daily.   celecoxib 100 MG capsule Commonly known as: CeleBREX Take 1 capsule (100 mg total) by mouth 2 (two) times daily for 10 days.   COQ10 GUMMIES ADULT PO Take 2 each by mouth daily.   docusate sodium 100 MG capsule Commonly known as: COLACE Take 1 capsule (100 mg total) by mouth 2 (two) times daily.   enoxaparin 40 MG/0.4ML injection Commonly known as: LOVENOX Inject 0.4 mLs (40 mg total) into the skin daily for 14 days.   FISH OIL ADULT GUMMIES PO Take 2 each by mouth daily.   hydrochlorothiazide 12.5 MG tablet Commonly known as: HYDRODIURIL Take 12.5 mg by mouth daily.   MAGNESIUM PO Take 2 tablets by mouth daily.   Multivitamin Adult Chew Chew 1 each by mouth daily.   ondansetron 4 MG tablet Commonly known as: ZOFRAN Take 1 tablet (4 mg total) by mouth every 6 (six) hours as needed for nausea.   OVER THE COUNTER MEDICATION Take 1 capsule by mouth daily. Chaga Mushroom Supplement   pravastatin 20 MG tablet Commonly known as: PRAVACHOL Take 20 mg by mouth at  bedtime.   traMADol 50 MG tablet Commonly known as: ULTRAM Take 1 tablet (50 mg total) by mouth every 6 (six) hours as needed for moderate pain (pain score 4-6).   Turmeric 500 MG Caps Take 1,000 mg by mouth daily.   VITAMIN C ADULT GUMMIES PO Take 2 each by mouth daily.   ZINC GUMMY PO Take 2 tablets by mouth daily.        Diagnostic Studies: DG HIP UNILAT WITH PELVIS 2-3 VIEWS RIGHT Result Date: 05/18/2023 CLINICAL DATA:  Right hip arthroplasty. EXAM: DG HIP (WITH OR WITHOUT PELVIS) 2-3V RIGHT COMPARISON:  Intraoperative radiographs of the left hip 01/15/2023. Right  hip MRI 09/09/2021. FINDINGS: C-arm fluoroscopy was provided in the operating room without the presence of a radiologist.24 seconds fluoroscopy time. 4.45 mGy air kerma. Three C-arm fluoroscopic images were obtained intraoperatively and are submitted for post operative interpretation. Images demonstrate interval right total hip arthroplasty with a screw fixed acetabular component. No complications are identified. There is gas within the surrounding soft tissues. Please see intraoperative findings for further detail. IMPRESSION: Intraoperative images during right total hip arthroplasty. Electronically Signed   By: Carey Bullocks M.D.   On: 05/18/2023 13:45   DG C-Arm 1-60 Min-No Report Result Date: 05/18/2023 Fluoroscopy was utilized by the requesting physician.  No radiographic interpretation.   DG C-Arm 1-60 Min-No Report Result Date: 05/18/2023 Fluoroscopy was utilized by the requesting physician.  No radiographic interpretation.   DG C-Arm 1-60 Min-No Report Result Date: 05/18/2023 Fluoroscopy was utilized by the requesting physician.  No radiographic interpretation.    Disposition:      Follow-up Information     Granvil, Djordjevic, PA-C Follow up in 2 week(s).   Specialties: Orthopedic Surgery, Emergency Medicine Why: 05/07/2023 9:00 AM Contact information: [address] [place]  [address] [phone]                  Signed: AZARYAH OLEKSY 05/20/2023, 8:22 AM

## 2023-05-19 NOTE — Progress Notes (Signed)
 Physical Therapy Treatment Patient Details Name: Stephen Schmidt MRN: [id number] DOB: 07-Feb-1951 Today's Date: 05/19/2023   History of Present Illness Pt is a 73 yo male s/p R THA. PMH of HTN, L THA.    PT Comments  Pt was long sitting in bed upon arrival. He is A and O x 4. Agrees to session and remains pleasant and motivated throughout session. Pt endorses 3/10 pain only. He was able to safely exit R side of bed, stand to RWE and ambulate to BR. Successfully urinated prior to ambulation into hallway. Pt ambulated ~ 50 ft in hallway prior to endorsing quick onset of dizziness/lightheadedness. Chair placed behind pt + cold washcloth applies to pt's forehead. BP readings after sitting x~ 2 minutes 89/61 then ~4 minutes 96/55. Ortho PA aware. Author will return shortly to re attempt OOB activity with pt. Pt was ambulating well prior to near syncopal episode.     If plan is discharge home, recommend the following: A little help with walking and/or transfers;Assistance with cooking/housework;A little help with bathing/dressing/bathroom;Assist for transportation;Help with stairs or ramp for entrance     Equipment Recommendations  None recommended by PT       Precautions / Restrictions Precautions Precautions: Fall Recall of Precautions/Restrictions: Intact Restrictions Weight Bearing Restrictions Per Provider Order: Yes RLE Weight Bearing Per Provider Order: Weight bearing as tolerated     Mobility  Bed Mobility Overal bed mobility: Modified Independent  General bed mobility comments: HOB was elevated. pt planning to sleep in son's recliner at DC    Transfers Overall transfer level: Needs assistance Equipment used: Rolling walker (2 wheels) Transfers: Sit to/from Stand Sit to Stand: Min assist, Mod assist  General transfer comment: Pt was able to stand from EOB(slightly elevated with min assist. mod assist from lower (hallway) chair after near syncopal episode. BP after ambulation ~ 50 ft   (89/61) then (96/55))    Ambulation/Gait Ambulation/Gait assistance: Supervision Gait Distance (Feet): 50 Feet Assistive device: Rolling walker (2 wheels) Gait Pattern/deviations: Step-through pattern, Antalgic, Trunk flexed Gait velocity: decreased  General Gait Details: Pt was ambulating well prior to c/o severe onset of dizziness/lighhtheadedness. Chair urgently placed behind pt. BP soft. MD /PA aware   Balance Overall balance assessment: Needs assistance Sitting-balance support: Feet supported Sitting balance-Leahy Scale: Good     Standing balance support: Bilateral upper extremity supported, During functional activity, Reliant on assistive device for balance Standing balance-Leahy Scale: Good      Communication Communication Communication: No apparent difficulties  Cognition Arousal: Alert Behavior During Therapy: WFL for tasks assessed/performed   PT - Cognitive impairments: No apparent impairments    PT - Cognition Comments: Pt is A and O x 4 and extremely pleasant /motivated Following commands: Intact      Cueing Cueing Techniques: Verbal cues     General Comments General comments (skin integrity, edema, etc.): session cut short due to near syncopal episode      Pertinent Vitals/Pain Pain Assessment Pain Assessment: 0-10 Pain Score: 3  Pain Descriptors / Indicators: Discomfort Pain Intervention(s): Limited activity within patient's tolerance, Monitored during session, Premedicated before session, Repositioned, Ice applied     PT Goals (current goals can now be found in the care plan section) Acute Rehab PT Goals Patient Stated Goal: to go home Progress towards PT goals: Progressing toward goals    Frequency    BID       AM-PAC PT "6 Clicks" Mobility   Outcome Measure  Help needed turning from  your back to your side while in a flat bed without using bedrails?: None Help needed moving from lying on your back to sitting on the side of a flat bed  without using bedrails?: A Little Help needed moving to and from a bed to a chair (including a wheelchair)?: A Little Help needed standing up from a chair using your arms (e.g., wheelchair or bedside chair)?: A Little Help needed to walk in hospital room?: A Little Help needed climbing 3-5 steps with a railing? : A Little 6 Click Score: 19    End of Session   Activity Tolerance: Patient tolerated treatment well Patient left: in chair;with call bell/phone within reach Nurse Communication: Mobility status PT Visit Diagnosis: Other abnormalities of gait and mobility (R26.89);Difficulty in walking, not elsewhere classified (R26.2);Muscle weakness (generalized) (M62.81);Pain Pain - Right/Left: Right Pain - part of body: Hip     Time: 0720-0743 PT Time Calculation (min) (ACUTE ONLY): 23 min  Charges:    $Gait Training: 8-22 mins $Therapeutic Activity: 8-22 mins PT General Charges $$ ACUTE PT VISIT: 1 Visit                     Jetta Lout PTA 05/19/23, 7:58 AM

## 2023-05-19 NOTE — Plan of Care (Signed)

## 2023-05-19 NOTE — Progress Notes (Signed)
   Subjective: 1 Day Post-Op Procedure(s) (LRB): ARTHROPLASTY, HIP, TOTAL, ANTERIOR APPROACH (Right) Patient reports pain as mild.   Patient is well, and has had no acute complaints or problems Denies any CP, SOB, ABD pain. We will continue therapy today.  Plan is to go Home after hospital stay. Patient attempted physical therapy this morning, became dizzy and lightheaded.  Patient currently doing well asymptomatic.  Blood pressure soft  Objective: Vital signs in last 24 hours: Temp:  [97 F (36.1 C)-98.5 F (36.9 C)] 98.4 F (36.9 C) (03/25 0630) Pulse Rate:  [57-95] 71 (03/25 0743) Resp:  [14-20] 16 (03/25 0630) BP: (89-144)/(51-70) 96/55 (03/25 0750) SpO2:  [97 %-100 %] 97 % (03/25 0743)  Intake/Output from previous day: 03/24 0701 - 03/25 0700 In: 4043.3 [P.O.:1320; I.V.:1723.8; IV Piggyback:999.5] Out: 2675 [Urine:2375; Blood:300] Intake/Output this shift: No intake/output data recorded.  Recent Labs    05/19/23 0551  HGB 11.0*   Recent Labs    05/19/23 0551  WBC 8.8  RBC 3.89*  HCT 31.6*  PLT 124*   Recent Labs    05/19/23 0551  NA 138  K 4.2  CL 104  CO2 28  BUN 26*  CREATININE 1.13  GLUCOSE 137*  CALCIUM 9.0   No results for input(s): "LABPT", "INR" in the last 72 hours.  EXAM General - Patient is Alert, Appropriate, and Oriented Extremity - Neurovascular intact Sensation intact distally Intact pulses distally Dorsiflexion/Plantar flexion intact No cellulitis present Compartment soft Dressing - dressing C/D/I and no drainage Motor Function - intact, moving foot and toes well on exam.   Past Medical History:  Diagnosis Date   CKD (chronic kidney disease) stage 3, GFR 30-59 ml/min (HCC)    DDD (degenerative disc disease), lumbosacral    Edema of both legs    with heaviness, swelling, and cramping   Hyperlipidemia    Hypertension    Hypertonicity of bladder    Leukopenia    Obesity, Class I, BMI 30.0-34.9 (see actual BMI)    However  notably lighter used to be.  Once 324 pounds in 1996   Osteoarthritis of both hips    Thrombocytopenia (HCC)     Assessment/Plan:   1 Day Post-Op Procedure(s) (LRB): ARTHROPLASTY, HIP, TOTAL, ANTERIOR APPROACH (Right) Principal Problem:   S/P total right hip arthroplasty  Estimated body mass index is 28.91 kg/m as calculated from the following:   Height as of this encounter: 6\' 5"  (1.956 m).   Weight as of this encounter: 110.6 kg. Advance diet Up with therapy Overall patient doing well.  Pain well-controlled Vital signs are stable but BP soft.  Will hold blood pressure medications this morning and 500 cc IV fluid bolus of normal saline. Labs are stable Care management to assist with discharge to home with home health PT today pending safe completion of PT goals later today.  DVT Prophylaxis - Lovenox, TED hose, and SCDs Weight-Bearing as tolerated to right leg   T. Cranston Neighbor, PA-C [facility] 05/19/2023, 8:01 AM

## 2023-05-19 NOTE — Progress Notes (Signed)
 Physical Therapy Treatment Patient Details Name: Stephen Schmidt MRN: [id number] DOB: Jul 22, 1950 Today's Date: 05/19/2023   History of Present Illness Pt is a 73 yo male s/p R THA. PMH of HTN, L THA.    PT Comments  Pt was sitting in recliner upon arrival. He is A and O x 4. Demonstrated safe abilities to stand, ambulate, and perform stairs. No symptoms of dizziness or orthostatic hypotension this session. BP after OOB activity  109/59 (75). Pt endorses feeling much better than first/AM session. DC recs remain appropriate to maximize independence and safety with all ADLs. Pt is cleared from an acute PT standpoint for safe DC home. Pt is planning to stay with his son at DC.    If plan is discharge home, recommend the following: A little help with walking and/or transfers;Assistance with cooking/housework;A little help with bathing/dressing/bathroom;Assist for transportation;Help with stairs or ramp for entrance   Can travel by private vehicle        Equipment Recommendations  None recommended by PT    Recommendations for Other Services       Precautions / Restrictions Precautions Precautions: Fall Recall of Precautions/Restrictions: Intact Restrictions Weight Bearing Restrictions Per Provider Order: Yes RLE Weight Bearing Per Provider Order: Weight bearing as tolerated     Mobility  Bed Mobility Overal bed mobility: Modified Independent             General bed mobility comments: pt was in recliner pre/post session    Transfers Overall transfer level: Needs assistance Equipment used: Rolling walker (2 wheels) Transfers: Sit to/from Stand Sit to Stand: Supervision           General transfer comment: pt stood from recliner 2 x with less assistance than earlier in the day. vcs for sliding fwd, flexing knees so feet are under him, and to increase fwd wt shift    Ambulation/Gait Ambulation/Gait assistance: Supervision Gait Distance (Feet): 200 Feet Assistive device:  Rolling walker (2 wheels) Gait Pattern/deviations: Step-through pattern Gait velocity: decreased     General Gait Details: pt ambulated ~ 200 ft without LOB or safety concerns. Vcs throughout for erecting posture. pt tends to have flexed posture with heavy UE support   Stairs Stairs: Yes Stairs assistance: Supervision Stair Management: Two rails, Step to pattern, Forwards Number of Stairs: 4 General stair comments: Pt was easily able to ascend/descend 4 stairs with step to sequencing.   Wheelchair Mobility     Tilt Bed    Modified Rankin (Stroke Patients Only)       Balance Overall balance assessment: Needs assistance Sitting-balance support: Feet supported Sitting balance-Leahy Scale: Good     Standing balance support: Bilateral upper extremity supported, During functional activity, Reliant on assistive device for balance Standing balance-Leahy Scale: Good                              Communication Communication Communication: No apparent difficulties  Cognition Arousal: Alert Behavior During Therapy: WFL for tasks assessed/performed   PT - Cognitive impairments: No apparent impairments                       PT - Cognition Comments: Pt is A and O x 4 and extremely pleasant /motivated Following commands: Intact      Cueing Cueing Techniques: Verbal cues  Exercises      General Comments General comments (skin integrity, edema, etc.): session cut short due to near  syncopal episode      Pertinent Vitals/Pain Pain Assessment Pain Assessment: 0-10 Pain Score: 4  Pain Location: hip Pain Descriptors / Indicators: Discomfort Pain Intervention(s): Limited activity within patient's tolerance, Monitored during session, Premedicated before session, Repositioned, Ice applied    Home Living                          Prior Function            PT Goals (current goals can now be found in the care plan section) Acute Rehab PT  Goals Patient Stated Goal: to go home Progress towards PT goals: Progressing toward goals    Frequency    BID      PT Plan      Co-evaluation              AM-PAC PT "6 Clicks" Mobility   Outcome Measure  Help needed turning from your back to your side while in a flat bed without using bedrails?: None Help needed moving from lying on your back to sitting on the side of a flat bed without using bedrails?: A Little Help needed moving to and from a bed to a chair (including a wheelchair)?: A Little Help needed standing up from a chair using your arms (e.g., wheelchair or bedside chair)?: A Little Help needed to walk in hospital room?: A Little Help needed climbing 3-5 steps with a railing? : A Little 6 Click Score: 19    End of Session   Activity Tolerance: Patient tolerated treatment well Patient left: in chair;with call bell/phone within reach Nurse Communication: Mobility status PT Visit Diagnosis: Other abnormalities of gait and mobility (R26.89);Difficulty in walking, not elsewhere classified (R26.2);Muscle weakness (generalized) (M62.81);Pain Pain - Right/Left: Right Pain - part of body: Hip     Time: 1610-9604 PT Time Calculation (min) (ACUTE ONLY): 21 min  Charges:    $Gait Training: 8-22 mins $Therapeutic Activity: 8-22 mins PT General Charges $$ ACUTE PT VISIT: 1 Visit                     Jetta Lout PTA 05/19/23, 11:08 AM

## 2023-05-20 DIAGNOSIS — M1611 Unilateral primary osteoarthritis, right hip: Secondary | ICD-10-CM | POA: Diagnosis not present

## 2023-05-20 LAB — CBC
HCT: 28.7 % — ABNORMAL LOW (ref 39.0–52.0)
Hemoglobin: 10.2 g/dL — ABNORMAL LOW (ref 13.0–17.0)
MCH: 28.2 pg (ref 26.0–34.0)
MCHC: 35.5 g/dL (ref 30.0–36.0)
MCV: 79.3 fL — ABNORMAL LOW (ref 80.0–100.0)
Platelets: 116 10*3/uL — ABNORMAL LOW (ref 150–400)
RBC: 3.62 MIL/uL — ABNORMAL LOW (ref 4.22–5.81)
RDW: 14.6 % (ref 11.5–15.5)
WBC: 6.5 10*3/uL (ref 4.0–10.5)
nRBC: 0 % (ref 0.0–0.2)

## 2023-05-20 NOTE — Progress Notes (Signed)
 DISCHARGE NOTE:  Pt dc with IV removed and eduction given. Pt voices no questions or concerns at this time. Pt has both TED hose on and in place. Pt given a sharps container for Lovenox injections.  Pt wheeled [facility] to the medical mall entrance by staff and pt's son provided transportation.

## 2023-05-20 NOTE — Progress Notes (Addendum)
 Physical Therapy Treatment Patient Details Name: Stephen Schmidt MRN: [id number] DOB: 23-Dec-1950 Today's Date: 05/20/2023   History of Present Illness Pt is a 73 yo male s/p R THA. PMH of HTN, L THA.    PT Comments  Pt was long sitting in bed upon arrival. He is A and O x 4 and endorsing feeling much better today versus previous date. BP at rest 136/78 after standing and ambulating short distance BP 144/70. No symptoms today of orthostatic hypotension. Author called pt's daughter (emergency contact) to discuss post acute care needs. Pt will have assistance at DC. Discuss pt/family keeping up with pt's BP and to elevated recliner surface to assist pt during transfers due to pt's (tall) height. Gait belt issued to pt for home use as well. Pt states confidence in safe DC home this date with continued skilled PT to maximize his independence and safety with all ADLs. HHPT scheduled to start on 05/22/23.     If plan is discharge home, recommend the following: A little help with walking and/or transfers;Assistance with cooking/housework;A little help with bathing/dressing/bathroom;Assist for transportation;Help with stairs or ramp for entrance     Equipment Recommendations  None recommended by PT       Precautions / Restrictions Precautions Precautions: Fall Recall of Precautions/Restrictions: Intact Restrictions Weight Bearing Restrictions Per Provider Order: Yes RLE Weight Bearing Per Provider Order: Weight bearing as tolerated     Mobility  Bed Mobility Overal bed mobility: Needs Assistance Bed Mobility: Supine to Sit  Supine to sit: Min assist  General bed mobility comments: pt is planning to stay at his sons house and sleep in recliner. will have assistance at DC per pt. confirmed with his daughter he will have assistance at DC    Transfers Overall transfer level: Needs assistance Equipment used: Rolling walker (2 wheels) Transfers: Sit to/from Stand Sit to Stand: Supervision, Contact  guard assist   Ambulation/Gait Ambulation/Gait assistance: Supervision Gait Distance (Feet): 50 Feet Assistive device: Rolling walker (2 wheels) Gait Pattern/deviations: Step-through pattern, Trunk flexed Gait velocity: decreased  General Gait Details: pt continues to have flexed posture with all standing activity. encouraged posture correction. no reports of dizziness or hypotensive concerns   Balance Overall balance assessment: Needs assistance Sitting-balance support: Feet supported Sitting balance-Leahy Scale: Good     Standing balance support: Bilateral upper extremity supported, During functional activity, Reliant on assistive device for balance Standing balance-Leahy Scale: Good      Communication Communication Communication: No apparent difficulties  Cognition Arousal: Alert Behavior During Therapy: WFL for tasks assessed/performed   PT - Cognitive impairments: No apparent impairments    PT - Cognition Comments: Pt is A and O x 4 and extremely pleasant /motivated Following commands: Intact      Cueing Cueing Techniques: Verbal cues     General Comments General comments (skin integrity, edema, etc.): discussed DC with pt then pt's daughter via phone call. Both pt/pt's daughter are confident in safe DC home later this date. pt will have assistance at DC.      Pertinent Vitals/Pain Pain Assessment Pain Assessment: 0-10 Pain Score: 2  Pain Location: hip Pain Descriptors / Indicators: Discomfort Pain Intervention(s): Limited activity within patient's tolerance, Monitored during session, Premedicated before session, Repositioned     PT Goals (current goals can now be found in the care plan section) Acute Rehab PT Goals Patient Stated Goal: go home Progress towards PT goals: Progressing toward goals    Frequency    BID  AM-PAC PT "6 Clicks" Mobility   Outcome Measure  Help needed turning from your back to your side while in a flat bed without using  bedrails?: None Help needed moving from lying on your back to sitting on the side of a flat bed without using bedrails?: A Little Help needed moving to and from a bed to a chair (including a wheelchair)?: A Little Help needed standing up from a chair using your arms (e.g., wheelchair or bedside chair)?: A Little Help needed to walk in hospital room?: A Little Help needed climbing 3-5 steps with a railing? : A Little 6 Click Score: 19    End of Session Equipment Utilized During Treatment: Gait belt (gave pt a gait belt for home use) Activity Tolerance: Patient tolerated treatment well Patient left:  (In BR having BM at conclsuion of PT session.) Nurse Communication: Mobility status PT Visit Diagnosis: Other abnormalities of gait and mobility (R26.89);Difficulty in walking, not elsewhere classified (R26.2);Muscle weakness (generalized) (M62.81);Pain Pain - Right/Left: Right Pain - part of body: Hip     Time: 1610-9604 PT Time Calculation (min) (ACUTE ONLY): 15 min  Charges:    $Therapeutic Activity: 8-22 mins PT General Charges $$ ACUTE PT VISIT: 1 Visit                     Jetta Lout PTA 05/20/23, 10:24 AM

## 2023-05-20 NOTE — Plan of Care (Signed)

## 2023-05-20 NOTE — Progress Notes (Signed)
   Subjective: 2 Days Post-Op Procedure(s) (LRB): ARTHROPLASTY, HIP, TOTAL, ANTERIOR APPROACH (Right) Patient reports pain as mild.   Patient is well, and has had no acute complaints or problems Denies any CP, SOB, ABD pain. We will continue therapy today.  Plan is to go Home after hospital stay.  Objective: Vital signs in last 24 hours: Temp:  [97.8 F (36.6 C)-99.7 F (37.6 C)] 99.7 F (37.6 C) (03/26 0411) Pulse Rate:  [71-89] 89 (03/26 0411) Resp:  [14-18] 18 (03/26 0411) BP: (101-148)/(42-65) 135/59 (03/26 0411) SpO2:  [96 %-100 %] 98 % (03/26 0411)  Intake/Output from previous day: 03/25 0701 - 03/26 0700 In: 6.2 [I.V.:6.2] Out: 2075 [Urine:2075] Intake/Output this shift: No intake/output data recorded.  Recent Labs    05/19/23 0551 05/20/23 0516  HGB 11.0* 10.2*   Recent Labs    05/19/23 0551 05/20/23 0516  WBC 8.8 6.5  RBC 3.89* 3.62*  HCT 31.6* 28.7*  PLT 124* 116*   Recent Labs    05/19/23 0551  NA 138  K 4.2  CL 104  CO2 28  BUN 26*  CREATININE 1.13  GLUCOSE 137*  CALCIUM 9.0   No results for input(s): "LABPT", "INR" in the last 72 hours.  EXAM General - Patient is Alert, Appropriate, and Oriented Extremity - Neurovascular intact Sensation intact distally Intact pulses distally Dorsiflexion/Plantar flexion intact No cellulitis present Compartment soft Dressing - dressing C/D/I and no drainage Motor Function - intact, moving foot and toes well on exam.   Past Medical History:  Diagnosis Date   CKD (chronic kidney disease) stage 3, GFR 30-59 ml/min (HCC)    DDD (degenerative disc disease), lumbosacral    Edema of both legs    with heaviness, swelling, and cramping   Hyperlipidemia    Hypertension    Hypertonicity of bladder    Leukopenia    Obesity, Class I, BMI 30.0-34.9 (see actual BMI)    However notably lighter used to be.  Once 324 pounds in 1996   Osteoarthritis of both hips    Thrombocytopenia (HCC)      Assessment/Plan:   2 Days Post-Op Procedure(s) (LRB): ARTHROPLASTY, HIP, TOTAL, ANTERIOR APPROACH (Right) Principal Problem:   S/P total right hip arthroplasty  Estimated body mass index is 28.91 kg/m as calculated from the following:   Height as of this encounter: 6\' 5"  (1.956 m).   Weight as of this encounter: 110.6 kg. Advance diet Up with therapy Overall patient doing well.  Pain well-controlled Vital signs are stable  Labs are stable Care management to assist with discharge to home with home health PT today  DVT Prophylaxis - Lovenox, TED hose, and SCDs Weight-Bearing as tolerated to right leg   T. Cranston Neighbor, PA-C [facility] 05/20/2023, 8:03 AM

## 2023-05-21 DIAGNOSIS — N1831 Chronic kidney disease, stage 3a: Secondary | ICD-10-CM | POA: Diagnosis not present

## 2023-05-21 DIAGNOSIS — M51379 Other intervertebral disc degeneration, lumbosacral region without mention of lumbar back pain or lower extremity pain: Secondary | ICD-10-CM | POA: Diagnosis not present

## 2023-05-21 DIAGNOSIS — Z96643 Presence of artificial hip joint, bilateral: Secondary | ICD-10-CM | POA: Diagnosis not present

## 2023-05-21 DIAGNOSIS — D696 Thrombocytopenia, unspecified: Secondary | ICD-10-CM | POA: Diagnosis not present

## 2023-05-21 DIAGNOSIS — Z471 Aftercare following joint replacement surgery: Secondary | ICD-10-CM | POA: Diagnosis not present

## 2023-05-21 DIAGNOSIS — I129 Hypertensive chronic kidney disease with stage 1 through stage 4 chronic kidney disease, or unspecified chronic kidney disease: Secondary | ICD-10-CM | POA: Diagnosis not present

## 2023-05-21 DIAGNOSIS — Z791 Long term (current) use of non-steroidal anti-inflammatories (NSAID): Secondary | ICD-10-CM | POA: Diagnosis not present

## 2023-05-21 DIAGNOSIS — D72819 Decreased white blood cell count, unspecified: Secondary | ICD-10-CM | POA: Diagnosis not present

## 2023-05-21 DIAGNOSIS — Z6828 Body mass index (BMI) 28.0-28.9, adult: Secondary | ICD-10-CM | POA: Diagnosis not present

## 2023-05-21 DIAGNOSIS — M170 Bilateral primary osteoarthritis of knee: Secondary | ICD-10-CM | POA: Diagnosis not present

## 2023-05-21 DIAGNOSIS — E66811 Obesity, class 1: Secondary | ICD-10-CM | POA: Diagnosis not present

## 2023-05-21 DIAGNOSIS — E7800 Pure hypercholesterolemia, unspecified: Secondary | ICD-10-CM | POA: Diagnosis not present

## 2023-06-10 DIAGNOSIS — Z96641 Presence of right artificial hip joint: Secondary | ICD-10-CM | POA: Diagnosis not present

## 2023-06-10 DIAGNOSIS — M25551 Pain in right hip: Secondary | ICD-10-CM | POA: Diagnosis not present

## 2023-06-16 DIAGNOSIS — M25551 Pain in right hip: Secondary | ICD-10-CM | POA: Diagnosis not present

## 2023-06-16 DIAGNOSIS — Z96641 Presence of right artificial hip joint: Secondary | ICD-10-CM | POA: Diagnosis not present

## 2023-06-23 DIAGNOSIS — M25551 Pain in right hip: Secondary | ICD-10-CM | POA: Diagnosis not present

## 2023-06-23 DIAGNOSIS — Z96641 Presence of right artificial hip joint: Secondary | ICD-10-CM | POA: Diagnosis not present

## 2023-06-26 DIAGNOSIS — Z96641 Presence of right artificial hip joint: Secondary | ICD-10-CM | POA: Diagnosis not present

## 2023-06-26 DIAGNOSIS — M25551 Pain in right hip: Secondary | ICD-10-CM | POA: Diagnosis not present

## 2023-06-29 DIAGNOSIS — M25551 Pain in right hip: Secondary | ICD-10-CM | POA: Diagnosis not present

## 2023-06-29 DIAGNOSIS — Z96641 Presence of right artificial hip joint: Secondary | ICD-10-CM | POA: Diagnosis not present

## 2023-06-30 DIAGNOSIS — Z96641 Presence of right artificial hip joint: Secondary | ICD-10-CM | POA: Diagnosis not present

## 2023-07-01 DIAGNOSIS — Z01818 Encounter for other preprocedural examination: Secondary | ICD-10-CM | POA: Diagnosis not present

## 2023-07-01 DIAGNOSIS — Z1211 Encounter for screening for malignant neoplasm of colon: Secondary | ICD-10-CM | POA: Diagnosis not present

## 2023-07-02 DIAGNOSIS — M25551 Pain in right hip: Secondary | ICD-10-CM | POA: Diagnosis not present

## 2023-07-02 DIAGNOSIS — Z96641 Presence of right artificial hip joint: Secondary | ICD-10-CM | POA: Diagnosis not present

## 2023-07-07 DIAGNOSIS — Z96641 Presence of right artificial hip joint: Secondary | ICD-10-CM | POA: Diagnosis not present

## 2023-07-07 DIAGNOSIS — M25551 Pain in right hip: Secondary | ICD-10-CM | POA: Diagnosis not present

## 2023-07-09 DIAGNOSIS — Z96641 Presence of right artificial hip joint: Secondary | ICD-10-CM | POA: Diagnosis not present

## 2023-07-09 DIAGNOSIS — M25551 Pain in right hip: Secondary | ICD-10-CM | POA: Diagnosis not present

## 2023-07-14 DIAGNOSIS — Z96641 Presence of right artificial hip joint: Secondary | ICD-10-CM | POA: Diagnosis not present

## 2023-07-14 DIAGNOSIS — M25551 Pain in right hip: Secondary | ICD-10-CM | POA: Diagnosis not present

## 2023-07-17 DIAGNOSIS — Z96641 Presence of right artificial hip joint: Secondary | ICD-10-CM | POA: Diagnosis not present

## 2023-07-17 DIAGNOSIS — M25551 Pain in right hip: Secondary | ICD-10-CM | POA: Diagnosis not present

## 2023-07-22 DIAGNOSIS — Z96641 Presence of right artificial hip joint: Secondary | ICD-10-CM | POA: Diagnosis not present

## 2023-07-22 DIAGNOSIS — M25551 Pain in right hip: Secondary | ICD-10-CM | POA: Diagnosis not present

## 2023-07-23 ENCOUNTER — Ambulatory Visit: Payer: Self-pay

## 2023-07-23 DIAGNOSIS — K6289 Other specified diseases of anus and rectum: Secondary | ICD-10-CM | POA: Diagnosis not present

## 2023-07-23 DIAGNOSIS — K641 Second degree hemorrhoids: Secondary | ICD-10-CM | POA: Diagnosis not present

## 2023-07-23 DIAGNOSIS — K621 Rectal polyp: Secondary | ICD-10-CM | POA: Diagnosis not present

## 2023-07-23 DIAGNOSIS — Z1211 Encounter for screening for malignant neoplasm of colon: Secondary | ICD-10-CM | POA: Diagnosis not present

## 2023-07-27 DIAGNOSIS — Z96641 Presence of right artificial hip joint: Secondary | ICD-10-CM | POA: Diagnosis not present

## 2023-07-27 DIAGNOSIS — M25551 Pain in right hip: Secondary | ICD-10-CM | POA: Diagnosis not present

## 2023-07-29 DIAGNOSIS — M1711 Unilateral primary osteoarthritis, right knee: Secondary | ICD-10-CM | POA: Diagnosis not present

## 2023-07-29 DIAGNOSIS — M25461 Effusion, right knee: Secondary | ICD-10-CM | POA: Diagnosis not present

## 2023-07-29 DIAGNOSIS — G8929 Other chronic pain: Secondary | ICD-10-CM | POA: Diagnosis not present

## 2023-07-29 DIAGNOSIS — M25561 Pain in right knee: Secondary | ICD-10-CM | POA: Diagnosis not present

## 2023-07-31 DIAGNOSIS — M25551 Pain in right hip: Secondary | ICD-10-CM | POA: Diagnosis not present

## 2023-08-03 DIAGNOSIS — M25551 Pain in right hip: Secondary | ICD-10-CM | POA: Diagnosis not present

## 2023-08-03 DIAGNOSIS — B351 Tinea unguium: Secondary | ICD-10-CM | POA: Diagnosis not present

## 2023-08-03 DIAGNOSIS — M79675 Pain in left toe(s): Secondary | ICD-10-CM | POA: Diagnosis not present

## 2023-08-03 DIAGNOSIS — M79674 Pain in right toe(s): Secondary | ICD-10-CM | POA: Diagnosis not present

## 2023-08-06 DIAGNOSIS — M1711 Unilateral primary osteoarthritis, right knee: Secondary | ICD-10-CM | POA: Diagnosis not present

## 2023-08-11 DIAGNOSIS — M25551 Pain in right hip: Secondary | ICD-10-CM | POA: Diagnosis not present

## 2023-08-14 DIAGNOSIS — M25551 Pain in right hip: Secondary | ICD-10-CM | POA: Diagnosis not present

## 2023-08-17 DIAGNOSIS — M25551 Pain in right hip: Secondary | ICD-10-CM | POA: Diagnosis not present

## 2023-08-17 DIAGNOSIS — Z96641 Presence of right artificial hip joint: Secondary | ICD-10-CM | POA: Diagnosis not present

## 2023-08-21 DIAGNOSIS — Z96641 Presence of right artificial hip joint: Secondary | ICD-10-CM | POA: Diagnosis not present

## 2023-08-21 DIAGNOSIS — M25551 Pain in right hip: Secondary | ICD-10-CM | POA: Diagnosis not present

## 2023-08-27 DIAGNOSIS — M25551 Pain in right hip: Secondary | ICD-10-CM | POA: Diagnosis not present

## 2023-08-27 DIAGNOSIS — Z96641 Presence of right artificial hip joint: Secondary | ICD-10-CM | POA: Diagnosis not present

## 2023-09-02 DIAGNOSIS — Z96641 Presence of right artificial hip joint: Secondary | ICD-10-CM | POA: Diagnosis not present

## 2023-09-02 DIAGNOSIS — M25551 Pain in right hip: Secondary | ICD-10-CM | POA: Diagnosis not present

## 2023-09-07 DIAGNOSIS — Z96641 Presence of right artificial hip joint: Secondary | ICD-10-CM | POA: Diagnosis not present

## 2023-09-07 DIAGNOSIS — M25551 Pain in right hip: Secondary | ICD-10-CM | POA: Diagnosis not present

## 2023-09-10 DIAGNOSIS — M25551 Pain in right hip: Secondary | ICD-10-CM | POA: Diagnosis not present

## 2023-09-10 DIAGNOSIS — Z96641 Presence of right artificial hip joint: Secondary | ICD-10-CM | POA: Diagnosis not present

## 2023-09-14 DIAGNOSIS — Z96641 Presence of right artificial hip joint: Secondary | ICD-10-CM | POA: Diagnosis not present

## 2023-09-14 DIAGNOSIS — M25551 Pain in right hip: Secondary | ICD-10-CM | POA: Diagnosis not present

## 2023-09-21 DIAGNOSIS — M25551 Pain in right hip: Secondary | ICD-10-CM | POA: Diagnosis not present

## 2023-09-21 DIAGNOSIS — Z96641 Presence of right artificial hip joint: Secondary | ICD-10-CM | POA: Diagnosis not present

## 2023-09-24 DIAGNOSIS — M25551 Pain in right hip: Secondary | ICD-10-CM | POA: Diagnosis not present

## 2023-09-24 DIAGNOSIS — Z96641 Presence of right artificial hip joint: Secondary | ICD-10-CM | POA: Diagnosis not present

## 2023-09-29 DIAGNOSIS — M25551 Pain in right hip: Secondary | ICD-10-CM | POA: Diagnosis not present

## 2023-09-29 DIAGNOSIS — Z96641 Presence of right artificial hip joint: Secondary | ICD-10-CM | POA: Diagnosis not present

## 2023-10-01 DIAGNOSIS — Z96641 Presence of right artificial hip joint: Secondary | ICD-10-CM | POA: Diagnosis not present

## 2023-10-01 DIAGNOSIS — M25551 Pain in right hip: Secondary | ICD-10-CM | POA: Diagnosis not present

## 2023-10-08 DIAGNOSIS — M25551 Pain in right hip: Secondary | ICD-10-CM | POA: Diagnosis not present

## 2023-10-08 DIAGNOSIS — Z96641 Presence of right artificial hip joint: Secondary | ICD-10-CM | POA: Diagnosis not present

## 2023-10-13 DIAGNOSIS — M25551 Pain in right hip: Secondary | ICD-10-CM | POA: Diagnosis not present

## 2023-10-19 DIAGNOSIS — M25551 Pain in right hip: Secondary | ICD-10-CM | POA: Diagnosis not present

## 2023-10-19 DIAGNOSIS — Z96641 Presence of right artificial hip joint: Secondary | ICD-10-CM | POA: Diagnosis not present

## 2023-10-20 DIAGNOSIS — Z96641 Presence of right artificial hip joint: Secondary | ICD-10-CM | POA: Diagnosis not present

## 2023-10-22 DIAGNOSIS — M25551 Pain in right hip: Secondary | ICD-10-CM | POA: Diagnosis not present

## 2023-10-22 DIAGNOSIS — Z96641 Presence of right artificial hip joint: Secondary | ICD-10-CM | POA: Diagnosis not present

## 2023-10-27 DIAGNOSIS — E7800 Pure hypercholesterolemia, unspecified: Secondary | ICD-10-CM | POA: Diagnosis not present

## 2023-10-27 DIAGNOSIS — D72819 Decreased white blood cell count, unspecified: Secondary | ICD-10-CM | POA: Diagnosis not present

## 2023-10-29 DIAGNOSIS — M25551 Pain in right hip: Secondary | ICD-10-CM | POA: Diagnosis not present

## 2023-10-29 DIAGNOSIS — Z96641 Presence of right artificial hip joint: Secondary | ICD-10-CM | POA: Diagnosis not present

## 2023-11-02 DIAGNOSIS — D72819 Decreased white blood cell count, unspecified: Secondary | ICD-10-CM | POA: Diagnosis not present

## 2023-11-02 DIAGNOSIS — Z0000 Encounter for general adult medical examination without abnormal findings: Secondary | ICD-10-CM | POA: Diagnosis not present

## 2023-11-02 DIAGNOSIS — Z1331 Encounter for screening for depression: Secondary | ICD-10-CM | POA: Diagnosis not present

## 2023-11-02 DIAGNOSIS — Z96641 Presence of right artificial hip joint: Secondary | ICD-10-CM | POA: Diagnosis not present

## 2023-11-02 DIAGNOSIS — I10 Essential (primary) hypertension: Secondary | ICD-10-CM | POA: Diagnosis not present

## 2023-11-02 DIAGNOSIS — E7800 Pure hypercholesterolemia, unspecified: Secondary | ICD-10-CM | POA: Diagnosis not present

## 2023-11-02 DIAGNOSIS — Z862 Personal history of diseases of the blood and blood-forming organs and certain disorders involving the immune mechanism: Secondary | ICD-10-CM | POA: Diagnosis not present

## 2023-11-02 DIAGNOSIS — M25551 Pain in right hip: Secondary | ICD-10-CM | POA: Diagnosis not present

## 2023-11-05 DIAGNOSIS — B351 Tinea unguium: Secondary | ICD-10-CM | POA: Diagnosis not present

## 2023-11-05 DIAGNOSIS — M79675 Pain in left toe(s): Secondary | ICD-10-CM | POA: Diagnosis not present

## 2023-11-05 DIAGNOSIS — M79674 Pain in right toe(s): Secondary | ICD-10-CM | POA: Diagnosis not present

## 2023-11-06 ENCOUNTER — Encounter: Payer: Self-pay | Admitting: *Deleted

## 2023-11-09 DIAGNOSIS — M25551 Pain in right hip: Secondary | ICD-10-CM | POA: Diagnosis not present

## 2023-11-09 DIAGNOSIS — Z96641 Presence of right artificial hip joint: Secondary | ICD-10-CM | POA: Diagnosis not present

## 2023-11-12 DIAGNOSIS — M25551 Pain in right hip: Secondary | ICD-10-CM | POA: Diagnosis not present

## 2023-11-12 DIAGNOSIS — Z96641 Presence of right artificial hip joint: Secondary | ICD-10-CM | POA: Diagnosis not present

## 2023-11-16 DIAGNOSIS — M25551 Pain in right hip: Secondary | ICD-10-CM | POA: Diagnosis not present

## 2023-11-16 DIAGNOSIS — Z96641 Presence of right artificial hip joint: Secondary | ICD-10-CM | POA: Diagnosis not present

## 2023-11-26 DIAGNOSIS — M25551 Pain in right hip: Secondary | ICD-10-CM | POA: Diagnosis not present

## 2023-11-26 DIAGNOSIS — Z96641 Presence of right artificial hip joint: Secondary | ICD-10-CM | POA: Diagnosis not present

## 2023-11-30 DIAGNOSIS — M25551 Pain in right hip: Secondary | ICD-10-CM | POA: Diagnosis not present

## 2023-11-30 DIAGNOSIS — Z96641 Presence of right artificial hip joint: Secondary | ICD-10-CM | POA: Diagnosis not present

## 2023-12-03 DIAGNOSIS — Z96641 Presence of right artificial hip joint: Secondary | ICD-10-CM | POA: Diagnosis not present

## 2023-12-03 DIAGNOSIS — M25551 Pain in right hip: Secondary | ICD-10-CM | POA: Diagnosis not present

## 2023-12-14 DIAGNOSIS — M25551 Pain in right hip: Secondary | ICD-10-CM | POA: Diagnosis not present

## 2023-12-14 DIAGNOSIS — Z96641 Presence of right artificial hip joint: Secondary | ICD-10-CM | POA: Diagnosis not present

## 2023-12-17 DIAGNOSIS — Z96641 Presence of right artificial hip joint: Secondary | ICD-10-CM | POA: Diagnosis not present

## 2023-12-17 DIAGNOSIS — M25551 Pain in right hip: Secondary | ICD-10-CM | POA: Diagnosis not present

## 2024-01-14 ENCOUNTER — Ambulatory Visit

## 2024-01-14 DIAGNOSIS — K621 Rectal polyp: Secondary | ICD-10-CM | POA: Diagnosis not present

## 2024-01-14 DIAGNOSIS — K6289 Other specified diseases of anus and rectum: Secondary | ICD-10-CM | POA: Diagnosis not present

## 2024-01-14 DIAGNOSIS — K640 First degree hemorrhoids: Secondary | ICD-10-CM | POA: Diagnosis not present

## 2024-01-15 ENCOUNTER — Ambulatory Visit: Admission: RE | Admit: 2024-01-15 | Source: Home / Self Care

## 2024-01-15 ENCOUNTER — Encounter: Admission: RE | Payer: Self-pay | Source: Home / Self Care

## 2024-01-15 HISTORY — DX: Sleep related leg cramps: G47.62

## 2024-01-15 HISTORY — DX: Male erectile dysfunction, unspecified: N52.9

## 2024-01-15 SURGERY — SIGMOIDOSCOPY, FLEXIBLE
Anesthesia: General

## 2024-02-03 DIAGNOSIS — M545 Low back pain, unspecified: Secondary | ICD-10-CM | POA: Diagnosis not present

## 2024-02-03 DIAGNOSIS — Z96641 Presence of right artificial hip joint: Secondary | ICD-10-CM | POA: Diagnosis not present

## 2024-02-03 DIAGNOSIS — Z96642 Presence of left artificial hip joint: Secondary | ICD-10-CM | POA: Diagnosis not present

## 2024-05-10 ENCOUNTER — Ambulatory Visit: Admitting: Oncology

## 2024-05-10 ENCOUNTER — Other Ambulatory Visit
